# Patient Record
Sex: Male | Born: 1950
Health system: Southern US, Community
[De-identification: ages and names within clinical notes are randomized; demographics above are authoritative.]

## PROBLEM LIST (undated history)

## (undated) DIAGNOSIS — C801 Malignant (primary) neoplasm, unspecified: Secondary | ICD-10-CM

## (undated) DIAGNOSIS — M1990 Unspecified osteoarthritis, unspecified site: Secondary | ICD-10-CM

## (undated) DIAGNOSIS — E119 Type 2 diabetes mellitus without complications: Secondary | ICD-10-CM

## (undated) DIAGNOSIS — B182 Chronic viral hepatitis C: Secondary | ICD-10-CM

## (undated) HISTORY — DX: Chronic viral hepatitis C: B18.2

## (undated) HISTORY — PX: NO PAST SURGERIES: SHX2092

---

## 2012-09-20 DIAGNOSIS — R079 Chest pain, unspecified: Secondary | ICD-10-CM

## 2015-12-24 DIAGNOSIS — Z6828 Body mass index (BMI) 28.0-28.9, adult: Secondary | ICD-10-CM | POA: Diagnosis not present

## 2015-12-24 DIAGNOSIS — E1165 Type 2 diabetes mellitus with hyperglycemia: Secondary | ICD-10-CM | POA: Diagnosis not present

## 2015-12-24 DIAGNOSIS — B001 Herpesviral vesicular dermatitis: Secondary | ICD-10-CM | POA: Diagnosis not present

## 2015-12-24 DIAGNOSIS — Z720 Tobacco use: Secondary | ICD-10-CM | POA: Diagnosis not present

## 2016-03-24 DIAGNOSIS — Z1389 Encounter for screening for other disorder: Secondary | ICD-10-CM | POA: Diagnosis not present

## 2016-03-24 DIAGNOSIS — Z0000 Encounter for general adult medical examination without abnormal findings: Secondary | ICD-10-CM | POA: Diagnosis not present

## 2016-03-24 DIAGNOSIS — R9431 Abnormal electrocardiogram [ECG] [EKG]: Secondary | ICD-10-CM | POA: Diagnosis not present

## 2016-03-24 DIAGNOSIS — Z1211 Encounter for screening for malignant neoplasm of colon: Secondary | ICD-10-CM | POA: Diagnosis not present

## 2016-03-24 DIAGNOSIS — E1165 Type 2 diabetes mellitus with hyperglycemia: Secondary | ICD-10-CM | POA: Diagnosis not present

## 2016-03-24 DIAGNOSIS — Z299 Encounter for prophylactic measures, unspecified: Secondary | ICD-10-CM | POA: Diagnosis not present

## 2016-03-24 DIAGNOSIS — R5383 Other fatigue: Secondary | ICD-10-CM | POA: Diagnosis not present

## 2016-03-24 DIAGNOSIS — Z7189 Other specified counseling: Secondary | ICD-10-CM | POA: Diagnosis not present

## 2016-04-04 NOTE — Progress Notes (Signed)
Patient ID: Spencer Mills, male   DOB: 08/09/1951, 65 y.o.   MRN: [id number]     Cardiology Office Note   Date:  04/06/2016   ID:  Spencer Mills, DOB 07-15-1951, MRN [id number]  PCP:  Monico Blitz, MD  Cardiologist:   Jenkins Rouge, MD   No chief complaint on file.     History of Present Illness: Spencer Mills is a 65 y.o. male who presents for evaluation for stress testing  He is a smoker with borderline HTN and diet controlled  Seen by Dr Manuella Ghazi in [place] and had abnormal ECG See below.  Patient use to be on glucophage But made him feel bad and stopped. 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  Had some SSCP 2 years ago and indicated having a normal ETT but Not clear how this would be given abnormal ECG.  Currently some fatigue and dyspnea. Rare muscular pain in chest. No family history of HOCM.  No palpitations or syncope     PMH:  Diet controlled DM,  HTN Borderline Smoking   Surgical History Hernia surgery   No current outpatient prescriptions on file.   No current facility-administered medications for this visit.  Shah's notes indicate lisinopril 5 mg and ASA  Allergies:   NKDA     Social History:  The patient  reports that he has been smoking.  He does not have any smokeless tobacco history on file. He reports that he drinks alcohol.  Smokes ppd since high school  Retired Passenger transport manager  3 children Wife is Diplomatic Services operational officer but no other exercise   Family History:  The patient's no premature CAD.  HTN on fathers side    ROS:  Please see the history of present illness.   Otherwise, review of systems are positive for none .   All other systems are reviewed and negative.    PHYSICAL EXAM: VS:  BP 140/70 mmHg  Pulse 80  Resp 11  Wt 78.019 kg (172 lb) , BMI There is no height on file to calculate BMI. Affect appropriate Healthy:  appears stated age 68: normal Neck supple with no  adenopathy JVP normal no bruits no thyromegaly Lungs clear with no wheezing and good diaphragmatic motion Heart:  S1/S2 no murmur, no rub, gallop or click PMI normal Abdomen: benighn, BS positve, no tenderness, no AAA no bruit.  No HSM or HJR Distal pulses intact with no bruits No edema Neuro non-focal Skin warm and dry No muscular weakness    EKG:  SR rate 61 LVH and biphasic T waves inferior lateral leads   Recent Labs: No results found for requested labs within last 365 days.    Lipid Panel No results found for: CHOL, TRIG, HDL, CHOLHDL, VLDL, LDLCALC, LDLDIRECT    Wt Readings from Last 3 Encounters:  04/06/16 78.019 kg (172 lb)      Other studies Reviewed: Additional studies/ records that were reviewed today include: Dr Brigitte Pulse notes and ECG.    ASSESSMENT AND PLAN:  1.  Abnormal ECG:  No old to compare.  F/u echo r/o HOCM.  F/U stress myovue r/o CAD 2. HTN:  Borderline not clear that he is taking ACE will encourage with DM especially if he has HTN response to exercise 3. DM:  Discussed low carb diet.  Target hemoglobin A1c is 6.5 or less.  Continue current medications. 4. Smoking:  Smoking cessation instruction/counseling  given:  counseled patient on the dangers of tobacco use, advised patient to stop smoking, and reviewed strategies to maximize success   Current medicines are reviewed at length with the patient today.  The patient does not have concerns regarding medicines.  The following changes have been made:  no change  Labs/ tests ordered today include: Echo and stress myovue   Orders Placed This Encounter  Procedures  . NM Myocar Multi W/Spect W/Wall Motion / EF  . ECHOCARDIOGRAM COMPLETE     Disposition:   FU with me next available      Signed, Jenkins Rouge, MD  04/06/2016 11:28 AM    [facility] [address], [place], [place]  [address] Phone: [phone]; Fax: [phone]

## 2016-04-06 ENCOUNTER — Encounter: Payer: Self-pay | Admitting: *Deleted

## 2016-04-06 ENCOUNTER — Ambulatory Visit (INDEPENDENT_AMBULATORY_CARE_PROVIDER_SITE_OTHER): Payer: PPO | Admitting: Cardiovascular Disease

## 2016-04-06 ENCOUNTER — Encounter: Payer: Self-pay | Admitting: Cardiovascular Disease

## 2016-04-06 VITALS — BP 140/70 | HR 80 | Resp 11 | Wt 172.0 lb

## 2016-04-06 DIAGNOSIS — Z125 Encounter for screening for malignant neoplasm of prostate: Secondary | ICD-10-CM | POA: Diagnosis not present

## 2016-04-06 DIAGNOSIS — R079 Chest pain, unspecified: Secondary | ICD-10-CM

## 2016-04-06 DIAGNOSIS — I422 Other hypertrophic cardiomyopathy: Secondary | ICD-10-CM

## 2016-04-06 DIAGNOSIS — Z79899 Other long term (current) drug therapy: Secondary | ICD-10-CM | POA: Diagnosis not present

## 2016-04-06 DIAGNOSIS — R5383 Other fatigue: Secondary | ICD-10-CM | POA: Diagnosis not present

## 2016-04-06 DIAGNOSIS — E1165 Type 2 diabetes mellitus with hyperglycemia: Secondary | ICD-10-CM | POA: Diagnosis not present

## 2016-04-06 DIAGNOSIS — E7800 Pure hypercholesterolemia, unspecified: Secondary | ICD-10-CM | POA: Diagnosis not present

## 2016-04-06 NOTE — Patient Instructions (Signed)
Your physician recommends that you schedule a follow-up appointment follow-up after test.   Your physician recommends that you continue on your current medications as directed. Please refer to the Current Medication list given to you today.  Your physician has requested that you have an echocardiogram. Echocardiography is a painless test that uses sound waves to create images of your heart. It provides your doctor with information about the size and shape of your heart and how well your heart's chambers and valves are working. This procedure takes approximately one hour. There are no restrictions for this procedure.  Your physician has requested that you have en exercise stress myoview. For further information please visit [email or web address]. Please follow instruction sheet, as given.  If you need a refill on your cardiac medications before your next appointment, please call your pharmacy.  Thank you for choosing [facility]!

## 2016-04-20 ENCOUNTER — Encounter (HOSPITAL_COMMUNITY): Payer: PPO

## 2016-04-20 ENCOUNTER — Ambulatory Visit (HOSPITAL_COMMUNITY): Admission: RE | Admit: 2016-04-20 | Payer: PPO | Source: Ambulatory Visit

## 2016-04-20 ENCOUNTER — Inpatient Hospital Stay (HOSPITAL_COMMUNITY): Admission: RE | Admit: 2016-04-20 | Payer: PPO | Source: Ambulatory Visit

## 2016-04-27 DIAGNOSIS — E7800 Pure hypercholesterolemia, unspecified: Secondary | ICD-10-CM | POA: Diagnosis not present

## 2016-04-27 DIAGNOSIS — R972 Elevated prostate specific antigen [PSA]: Secondary | ICD-10-CM | POA: Diagnosis not present

## 2016-04-27 DIAGNOSIS — N181 Chronic kidney disease, stage 1: Secondary | ICD-10-CM | POA: Diagnosis not present

## 2016-04-27 DIAGNOSIS — E1122 Type 2 diabetes mellitus with diabetic chronic kidney disease: Secondary | ICD-10-CM | POA: Diagnosis not present

## 2016-04-28 ENCOUNTER — Encounter (HOSPITAL_COMMUNITY)
Admission: RE | Admit: 2016-04-28 | Discharge: 2016-04-28 | Disposition: A | Discharge status: Home / Self Care | Payer: PPO | Source: Ambulatory Visit | Attending: Cardiovascular Disease | Admitting: Cardiovascular Disease

## 2016-04-28 ENCOUNTER — Ambulatory Visit (HOSPITAL_COMMUNITY)
Admission: RE | Admit: 2016-04-28 | Discharge: 2016-04-28 | Disposition: A | Discharge status: Home / Self Care | Payer: PPO | Source: Ambulatory Visit | Attending: Cardiovascular Disease | Admitting: Cardiovascular Disease

## 2016-04-28 ENCOUNTER — Inpatient Hospital Stay (HOSPITAL_COMMUNITY): Admission: RE | Admit: 2016-04-28 | Payer: PPO | Source: Ambulatory Visit

## 2016-04-28 ENCOUNTER — Encounter (HOSPITAL_COMMUNITY): Payer: Self-pay

## 2016-04-28 DIAGNOSIS — I421 Obstructive hypertrophic cardiomyopathy: Secondary | ICD-10-CM | POA: Diagnosis not present

## 2016-04-28 DIAGNOSIS — I422 Other hypertrophic cardiomyopathy: Secondary | ICD-10-CM | POA: Diagnosis not present

## 2016-04-28 DIAGNOSIS — R079 Chest pain, unspecified: Secondary | ICD-10-CM | POA: Diagnosis not present

## 2016-04-28 DIAGNOSIS — I517 Cardiomegaly: Secondary | ICD-10-CM | POA: Insufficient documentation

## 2016-04-28 HISTORY — DX: Type 2 diabetes mellitus without complications: E11.9

## 2016-04-28 LAB — ECHOCARDIOGRAM COMPLETE
CHL CUP RV SYS PRESS: 22 mmHg
CHL CUP STROKE VOLUME: 39 mL
E decel time: 243 msec
EERAT: 7.81
FS: 42 % (ref 28–44)
IVS/LV PW RATIO, ED: 1.11
LA ID, A-P, ES: 41 mm
LA diam index: 2.11 cm/m2
LA vol A4C: 61 ml
LA vol index: 31.6 mL/m2
LAVOL: 61.5 mL
LEFT ATRIUM END SYS DIAM: 41 mm
LV E/e'average: 7.81
LV SIMPSON'S DISK: 63
LV TDI E'LATERAL: 9.36
LV dias vol index: 32 mL/m2
LV dias vol: 62 mL (ref 62–150)
LV sys vol index: 12 mL/m2
LVEEMED: 7.81
LVELAT: 9.36 cm/s
LVOT area: 3.14 cm2
LVOT diameter: 20 mm
LVSYSVOL: 23 mL (ref 21–61)
MV Dec: 243
MV Peak grad: 2 mmHg
MVPKAVEL: 66.7 m/s
MVPKEVEL: 73.1 m/s
PW: 12.7 mm — AB (ref 0.6–1.1)
RV LATERAL S' VELOCITY: 16.2 cm/s
Reg peak vel: 189 cm/s
TAPSE: 27.7 mm
TDI e' medial: 5.87
TR max vel: 189 cm/s

## 2016-04-28 LAB — NM MYOCAR MULTI W/SPECT W/WALL MOTION / EF
CHL CUP MPHR: 155 {beats}/min
CHL CUP NUCLEAR SDS: 0
CHL CUP RESTING HR STRESS: 48 {beats}/min
CSEPEW: 10.1 METS
CSEPHR: 71 %
CSEPPHR: 111 {beats}/min
Exercise duration (min): 7 min
Exercise duration (sec): 15 s
LV dias vol: 94 mL (ref 62–150)
LV sys vol: 40 mL
RATE: 0.35
RPE: 12
SRS: 0
SSS: 0
TID: 0.97

## 2016-04-28 MED ORDER — REGADENOSON 0.4 MG/5ML IV SOLN
INTRAVENOUS | Status: AC
  Administered 2016-04-28: 0.4 mg via INTRAVENOUS
  Filled 2016-04-28: qty 5

## 2016-04-28 MED ORDER — TECHNETIUM TC 99M TETROFOSMIN IV KIT
10.0000 | PACK | Freq: Once | INTRAVENOUS | Status: AC | PRN
  Administered 2016-04-28: 10.6 via INTRAVENOUS

## 2016-04-28 MED ORDER — TECHNETIUM TC 99M TETROFOSMIN IV KIT
30.0000 | PACK | Freq: Once | INTRAVENOUS | Status: AC | PRN
  Administered 2016-04-28: 31 via INTRAVENOUS

## 2016-04-28 MED ORDER — SODIUM CHLORIDE 0.9% FLUSH
INTRAVENOUS | Status: AC
  Administered 2016-04-28: 10 mL via INTRAVENOUS
  Filled 2016-04-28: qty 10

## 2016-04-28 NOTE — Progress Notes (Signed)
*  PRELIMINARY RESULTS* Echocardiogram 2D Echocardiogram has been performed.  Spencer Mills 04/28/2016, 11:21 AM

## 2016-04-29 ENCOUNTER — Ambulatory Visit (INDEPENDENT_AMBULATORY_CARE_PROVIDER_SITE_OTHER): Payer: PPO | Admitting: Cardiology

## 2016-04-29 ENCOUNTER — Encounter: Payer: Self-pay | Admitting: Cardiology

## 2016-04-29 VITALS — BP 151/80 | HR 66 | Ht 68.0 in | Wt 172.0 lb

## 2016-04-29 DIAGNOSIS — R9431 Abnormal electrocardiogram [ECG] [EKG]: Secondary | ICD-10-CM

## 2016-04-29 DIAGNOSIS — R030 Elevated blood-pressure reading, without diagnosis of hypertension: Secondary | ICD-10-CM

## 2016-04-29 DIAGNOSIS — IMO0001 Reserved for inherently not codable concepts without codable children: Secondary | ICD-10-CM

## 2016-04-29 NOTE — Patient Instructions (Signed)
Medication Instructions:  Your physician recommends that you continue on your current medications as directed. Please refer to the Current Medication list given to you today. 3  Labwork: NONE  Testing/Procedures: NONE  Follow-Up: Your physician wants you to follow-up in: 6 MONTHS.  You will receive a reminder letter in the mail two months in advance. If you don't receive a letter, please call our office to schedule the follow-up appointment.   Any Other Special Instructions Will Be Listed Below (If Applicable).  DASH Eating Plan DASH stands for "Dietary Approaches to Stop Hypertension." The DASH eating plan is a healthy eating plan that has been shown to reduce high blood pressure (hypertension). Additional health benefits may include reducing the risk of type 2 diabetes mellitus, heart disease, and stroke. The DASH eating plan may also help with weight loss. WHAT DO I NEED TO KNOW ABOUT THE DASH EATING PLAN? For the DASH eating plan, you will follow these general guidelines:  Choose foods with a percent daily value for sodium of less than 5% (as listed on the food label).  Use salt-free seasonings or herbs instead of table salt or sea salt.  Check with your health care provider or pharmacist before using salt substitutes.  Eat lower-sodium products, often labeled as "lower sodium" or "no salt added."  Eat fresh foods.  Eat more vegetables, fruits, and low-fat dairy products.  Choose whole grains. Look for the word "whole" as the first word in the ingredient list.  Choose fish and skinless chicken or [place] more often than red meat. Limit fish, poultry, and meat to 6 oz (170 g) each day.  Limit sweets, desserts, sugars, and sugary drinks.  Choose heart-healthy fats.  Limit cheese to 1 oz (28 g) per day.  Eat more home-cooked food and less restaurant, buffet, and fast food.  Limit fried foods.  Cook foods using methods other than frying.  Limit canned vegetables. If  you do use them, rinse them well to decrease the sodium.  When eating at a restaurant, ask that your food be prepared with less salt, or no salt if possible. WHAT FOODS CAN I EAT? Seek help from a dietitian for individual calorie needs. Grains Whole grain or whole wheat bread. Brown rice. Whole grain or whole wheat pasta. Quinoa, bulgur, and whole grain cereals. Low-sodium cereals. Corn or whole wheat flour tortillas. Whole grain cornbread. Whole grain crackers. Low-sodium crackers. Vegetables Fresh or frozen vegetables (raw, steamed, roasted, or grilled). Low-sodium or reduced-sodium tomato and vegetable juices. Low-sodium or reduced-sodium tomato sauce and paste. Low-sodium or reduced-sodium canned vegetables.  Fruits All fresh, canned (in natural juice), or frozen fruits. Meat and Other Protein Products Ground beef (85% or leaner), grass-fed beef, or beef trimmed of fat. Skinless chicken or [place]. Ground chicken or [place]. Pork trimmed of fat. All fish and seafood. Eggs. Dried beans, peas, or lentils. Unsalted nuts and seeds. Unsalted canned beans. Dairy Low-fat dairy products, such as skim or 1% milk, 2% or reduced-fat cheeses, low-fat ricotta or cottage cheese, or plain low-fat yogurt. Low-sodium or reduced-sodium cheeses. Fats and Oils Tub margarines without trans fats. Light or reduced-fat mayonnaise and salad dressings (reduced sodium). Avocado. Safflower, olive, or canola oils. Natural peanut or almond butter. Other Unsalted popcorn and pretzels. The items listed above may not be a complete list of recommended foods or beverages. Contact your dietitian for more options. WHAT FOODS ARE NOT RECOMMENDED? Grains White bread. White pasta. White rice. Refined cornbread. Bagels and croissants. Crackers that contain  trans fat. Vegetables Creamed or fried vegetables. Vegetables in a cheese sauce. Regular canned vegetables. Regular canned tomato sauce and paste. Regular tomato and vegetable  juices. Fruits Dried fruits. Canned fruit in light or heavy syrup. Fruit juice. Meat and Other Protein Products Fatty cuts of meat. Ribs, chicken wings, bacon, sausage, bologna, salami, chitterlings, fatback, hot dogs, bratwurst, and packaged luncheon meats. Salted nuts and seeds. Canned beans with salt. Dairy Whole or 2% milk, cream, half-and-half, and cream cheese. Whole-fat or sweetened yogurt. Full-fat cheeses or blue cheese. Nondairy creamers and whipped toppings. Processed cheese, cheese spreads, or cheese curds. Condiments Onion and garlic salt, seasoned salt, table salt, and sea salt. Canned and packaged gravies. Worcestershire sauce. Tartar sauce. Barbecue sauce. Teriyaki sauce. Soy sauce, including reduced sodium. Steak sauce. Fish sauce. Oyster sauce. Cocktail sauce. Horseradish. Ketchup and mustard. Meat flavorings and tenderizers. Bouillon cubes. Hot sauce. Tabasco sauce. Marinades. Taco seasonings. Relishes. Fats and Oils Butter, stick margarine, lard, shortening, ghee, and bacon fat. Coconut, palm kernel, or palm oils. Regular salad dressings. Other Pickles and olives. Salted popcorn and pretzels. The items listed above may not be a complete list of foods and beverages to avoid. Contact your dietitian for more information. WHERE CAN I FIND MORE INFORMATION? National Heart, Lung, and Blood Institute: travelstabloid.com   This information is not intended to replace advice given to you by your health care provider. Make sure you discuss any questions you have with your health care provider.   Document Released: 10/06/2011 Document Revised: 11/07/2014 Document Reviewed: 08/21/2013 Elsevier Interactive Patient Education [employer].    If you need a refill on your cardiac medications before your next appointment, please call your pharmacy.

## 2016-04-29 NOTE — Progress Notes (Signed)
     Clinical Summary Mr. Spencer Mills is a 65 y.o.male last seen by Dr Johnsie Cancel, this is our first visit together. He is seen for the following medical problems.  1. Abnormal EKG - EKG shows LVH with strain pattern, cannot rule out lateral ischemia - echo 03/2016 LVEF 60-65%, mild LVH.  - 03/2016 exercise nuclear stress without ischemia. Exercised 7 minutes - he denies any chest pain, SOB, DOE  Past Medical History  Diagnosis Date  . Diabetes mellitus without complication ([place])      Medications None   No current outpatient prescriptions on file.   No current facility-administered medications for this visit.     No past surgical history on file.     No family history on file.   Social History Mr. Spencer Mills reports that he has been smoking.  He does not have any smokeless tobacco history on file. Mr. Spencer Mills reports that he drinks alcohol.   Review of Systems CONSTITUTIONAL: No weight loss, fever, chills, weakness or fatigue.  HEENT: Eyes: No visual loss, blurred vision, double vision or yellow sclerae.No hearing loss, sneezing, congestion, runny nose or sore throat.  SKIN: No rash or itching.  CARDIOVASCULAR: per HPI RESPIRATORY: No shortness of breath, cough or sputum.  GASTROINTESTINAL: No anorexia, nausea, vomiting or diarrhea. No abdominal pain or blood.  GENITOURINARY: No burning on urination, no polyuria NEUROLOGICAL: No headache, dizziness, syncope, paralysis, ataxia, numbness or tingling in the extremities. No change in bowel or bladder control.  MUSCULOSKELETAL: No muscle, back pain, joint pain or stiffness.  LYMPHATICS: No enlarged nodes. No history of splenectomy.  PSYCHIATRIC: No history of depression or anxiety.  ENDOCRINOLOGIC: No reports of sweating, cold or heat intolerance. No polyuria or polydipsia.  Marland Kitchen   Physical Examination Filed Vitals:   04/29/16 1033  BP: 151/80  Pulse: 66   Filed Vitals:   04/29/16 1033  Height: 5\' 8"  (1.727 m)  Weight: 172  lb (78.019 kg)    Gen: resting comfortably, no acute distress HEENT: no scleral icterus, pupils equal round and reactive, no palptable cervical adenopathy,  CV: RRR, no m/r/g, no jvd Resp: Clear to auscultation bilaterally GI: abdomen is soft, non-tender, non-distended, normal bowel sounds, no hepatosplenomegaly MSK: extremities are warm, no edema.  Skin: warm, no rash Neuro:  no focal deficits Psych: appropriate affect     Assessment and Plan  1. Abnormal EKG - no cardiac symptoms - echo and stress test show no evidence of significant structural heart disease. EKG findings likely secondary to his mild LVH.   2. Elevated blood pressure Continue to monitor bp's, he has no history of HTN however bp is elevated in clinic today. Will provide info on DASH diet, encouraged diet and exercise chnages. May need HTN meds in the near future.  - No further cardiac testing at this time  F/u 6 months      Arnoldo Lenis, M.D.

## 2016-05-11 DIAGNOSIS — E119 Type 2 diabetes mellitus without complications: Secondary | ICD-10-CM | POA: Diagnosis not present

## 2016-05-11 DIAGNOSIS — I10 Essential (primary) hypertension: Secondary | ICD-10-CM | POA: Diagnosis not present

## 2016-07-11 DIAGNOSIS — E7800 Pure hypercholesterolemia, unspecified: Secondary | ICD-10-CM | POA: Diagnosis not present

## 2016-07-11 DIAGNOSIS — Z6826 Body mass index (BMI) 26.0-26.9, adult: Secondary | ICD-10-CM | POA: Diagnosis not present

## 2016-07-11 DIAGNOSIS — E1122 Type 2 diabetes mellitus with diabetic chronic kidney disease: Secondary | ICD-10-CM | POA: Diagnosis not present

## 2016-07-11 DIAGNOSIS — I10 Essential (primary) hypertension: Secondary | ICD-10-CM | POA: Diagnosis not present

## 2016-07-11 DIAGNOSIS — N181 Chronic kidney disease, stage 1: Secondary | ICD-10-CM | POA: Diagnosis not present

## 2016-08-11 DIAGNOSIS — I10 Essential (primary) hypertension: Secondary | ICD-10-CM | POA: Diagnosis not present

## 2016-08-11 DIAGNOSIS — E119 Type 2 diabetes mellitus without complications: Secondary | ICD-10-CM | POA: Diagnosis not present

## 2016-08-19 DIAGNOSIS — E1122 Type 2 diabetes mellitus with diabetic chronic kidney disease: Secondary | ICD-10-CM | POA: Diagnosis not present

## 2016-08-19 DIAGNOSIS — Z6825 Body mass index (BMI) 25.0-25.9, adult: Secondary | ICD-10-CM | POA: Diagnosis not present

## 2016-08-19 DIAGNOSIS — F17200 Nicotine dependence, unspecified, uncomplicated: Secondary | ICD-10-CM | POA: Diagnosis not present

## 2016-08-19 DIAGNOSIS — Z299 Encounter for prophylactic measures, unspecified: Secondary | ICD-10-CM | POA: Diagnosis not present

## 2016-08-19 DIAGNOSIS — E7800 Pure hypercholesterolemia, unspecified: Secondary | ICD-10-CM | POA: Diagnosis not present

## 2016-08-19 DIAGNOSIS — N181 Chronic kidney disease, stage 1: Secondary | ICD-10-CM | POA: Diagnosis not present

## 2016-08-19 DIAGNOSIS — K219 Gastro-esophageal reflux disease without esophagitis: Secondary | ICD-10-CM | POA: Diagnosis not present

## 2016-08-23 DIAGNOSIS — N181 Chronic kidney disease, stage 1: Secondary | ICD-10-CM | POA: Diagnosis not present

## 2016-08-23 DIAGNOSIS — E1122 Type 2 diabetes mellitus with diabetic chronic kidney disease: Secondary | ICD-10-CM | POA: Diagnosis not present

## 2016-08-23 DIAGNOSIS — R972 Elevated prostate specific antigen [PSA]: Secondary | ICD-10-CM | POA: Diagnosis not present

## 2016-08-23 DIAGNOSIS — Z6825 Body mass index (BMI) 25.0-25.9, adult: Secondary | ICD-10-CM | POA: Diagnosis not present

## 2016-08-23 DIAGNOSIS — R198 Other specified symptoms and signs involving the digestive system and abdomen: Secondary | ICD-10-CM | POA: Diagnosis not present

## 2016-08-23 DIAGNOSIS — E7800 Pure hypercholesterolemia, unspecified: Secondary | ICD-10-CM | POA: Diagnosis not present

## 2016-08-23 DIAGNOSIS — Z299 Encounter for prophylactic measures, unspecified: Secondary | ICD-10-CM | POA: Diagnosis not present

## 2016-08-24 DIAGNOSIS — R109 Unspecified abdominal pain: Secondary | ICD-10-CM | POA: Diagnosis not present

## 2016-08-24 DIAGNOSIS — R935 Abnormal findings on diagnostic imaging of other abdominal regions, including retroperitoneum: Secondary | ICD-10-CM | POA: Diagnosis not present

## 2016-08-31 DIAGNOSIS — Z299 Encounter for prophylactic measures, unspecified: Secondary | ICD-10-CM | POA: Diagnosis not present

## 2016-08-31 DIAGNOSIS — Z6825 Body mass index (BMI) 25.0-25.9, adult: Secondary | ICD-10-CM | POA: Diagnosis not present

## 2016-08-31 DIAGNOSIS — K859 Acute pancreatitis without necrosis or infection, unspecified: Secondary | ICD-10-CM | POA: Diagnosis not present

## 2016-08-31 DIAGNOSIS — B182 Chronic viral hepatitis C: Secondary | ICD-10-CM

## 2016-08-31 DIAGNOSIS — E7800 Pure hypercholesterolemia, unspecified: Secondary | ICD-10-CM | POA: Diagnosis not present

## 2016-08-31 DIAGNOSIS — R198 Other specified symptoms and signs involving the digestive system and abdomen: Secondary | ICD-10-CM | POA: Diagnosis not present

## 2016-08-31 DIAGNOSIS — K219 Gastro-esophageal reflux disease without esophagitis: Secondary | ICD-10-CM | POA: Diagnosis not present

## 2016-08-31 HISTORY — DX: Chronic viral hepatitis C: B18.2

## 2016-09-09 DIAGNOSIS — I10 Essential (primary) hypertension: Secondary | ICD-10-CM | POA: Diagnosis not present

## 2016-09-09 DIAGNOSIS — E119 Type 2 diabetes mellitus without complications: Secondary | ICD-10-CM | POA: Diagnosis not present

## 2016-09-16 DIAGNOSIS — K859 Acute pancreatitis without necrosis or infection, unspecified: Secondary | ICD-10-CM | POA: Diagnosis not present

## 2016-10-03 DIAGNOSIS — E119 Type 2 diabetes mellitus without complications: Secondary | ICD-10-CM | POA: Diagnosis not present

## 2016-10-03 DIAGNOSIS — I10 Essential (primary) hypertension: Secondary | ICD-10-CM | POA: Diagnosis not present

## 2016-10-12 DIAGNOSIS — Z299 Encounter for prophylactic measures, unspecified: Secondary | ICD-10-CM | POA: Diagnosis not present

## 2016-10-12 DIAGNOSIS — E1122 Type 2 diabetes mellitus with diabetic chronic kidney disease: Secondary | ICD-10-CM | POA: Diagnosis not present

## 2016-10-12 DIAGNOSIS — R7989 Other specified abnormal findings of blood chemistry: Secondary | ICD-10-CM | POA: Diagnosis not present

## 2016-10-12 DIAGNOSIS — N181 Chronic kidney disease, stage 1: Secondary | ICD-10-CM | POA: Diagnosis not present

## 2016-10-12 DIAGNOSIS — Z6826 Body mass index (BMI) 26.0-26.9, adult: Secondary | ICD-10-CM | POA: Diagnosis not present

## 2016-11-04 DIAGNOSIS — R7989 Other specified abnormal findings of blood chemistry: Secondary | ICD-10-CM | POA: Diagnosis not present

## 2016-11-07 DIAGNOSIS — R945 Abnormal results of liver function studies: Secondary | ICD-10-CM | POA: Diagnosis not present

## 2016-11-07 DIAGNOSIS — N281 Cyst of kidney, acquired: Secondary | ICD-10-CM | POA: Diagnosis not present

## 2016-11-07 DIAGNOSIS — K8020 Calculus of gallbladder without cholecystitis without obstruction: Secondary | ICD-10-CM | POA: Diagnosis not present

## 2016-11-08 DIAGNOSIS — R768 Other specified abnormal immunological findings in serum: Secondary | ICD-10-CM | POA: Diagnosis not present

## 2016-11-10 DIAGNOSIS — E119 Type 2 diabetes mellitus without complications: Secondary | ICD-10-CM | POA: Diagnosis not present

## 2016-11-10 DIAGNOSIS — I10 Essential (primary) hypertension: Secondary | ICD-10-CM | POA: Diagnosis not present

## 2016-11-14 DIAGNOSIS — Z713 Dietary counseling and surveillance: Secondary | ICD-10-CM | POA: Diagnosis not present

## 2016-11-14 DIAGNOSIS — Z87891 Personal history of nicotine dependence: Secondary | ICD-10-CM | POA: Diagnosis not present

## 2016-11-14 DIAGNOSIS — E7800 Pure hypercholesterolemia, unspecified: Secondary | ICD-10-CM | POA: Diagnosis not present

## 2016-11-14 DIAGNOSIS — Z6828 Body mass index (BMI) 28.0-28.9, adult: Secondary | ICD-10-CM | POA: Diagnosis not present

## 2016-11-14 DIAGNOSIS — N181 Chronic kidney disease, stage 1: Secondary | ICD-10-CM | POA: Diagnosis not present

## 2016-11-14 DIAGNOSIS — E1122 Type 2 diabetes mellitus with diabetic chronic kidney disease: Secondary | ICD-10-CM | POA: Diagnosis not present

## 2016-11-14 DIAGNOSIS — B1920 Unspecified viral hepatitis C without hepatic coma: Secondary | ICD-10-CM | POA: Diagnosis not present

## 2016-11-14 DIAGNOSIS — Z299 Encounter for prophylactic measures, unspecified: Secondary | ICD-10-CM | POA: Diagnosis not present

## 2016-11-14 DIAGNOSIS — I10 Essential (primary) hypertension: Secondary | ICD-10-CM | POA: Diagnosis not present

## 2016-11-21 ENCOUNTER — Encounter: Payer: Self-pay | Admitting: Gastroenterology

## 2016-12-08 ENCOUNTER — Ambulatory Visit (INDEPENDENT_AMBULATORY_CARE_PROVIDER_SITE_OTHER): Payer: PPO | Admitting: Gastroenterology

## 2016-12-08 ENCOUNTER — Encounter: Payer: Self-pay | Admitting: Gastroenterology

## 2016-12-08 DIAGNOSIS — B182 Chronic viral hepatitis C: Secondary | ICD-10-CM

## 2016-12-08 DIAGNOSIS — K859 Acute pancreatitis without necrosis or infection, unspecified: Secondary | ICD-10-CM | POA: Insufficient documentation

## 2016-12-08 DIAGNOSIS — Z1211 Encounter for screening for malignant neoplasm of colon: Secondary | ICD-10-CM

## 2016-12-08 DIAGNOSIS — K852 Alcohol induced acute pancreatitis without necrosis or infection: Secondary | ICD-10-CM

## 2016-12-08 LAB — CBC WITH DIFFERENTIAL/PLATELET
BASOS ABS: 88 {cells}/uL (ref 0–200)
Basophils Relative: 1 %
EOS ABS: 616 {cells}/uL — AB (ref 15–500)
EOS PCT: 7 %
HCT: 45.9 % (ref 38.5–50.0)
HEMOGLOBIN: 15.6 g/dL (ref 13.2–17.1)
Lymphocytes Relative: 49 %
Lymphs Abs: 4312 cells/uL — ABNORMAL HIGH (ref 850–3900)
MCH: 31.4 pg (ref 27.0–33.0)
MCHC: 34 g/dL (ref 32.0–36.0)
MCV: 92.4 fL (ref 80.0–100.0)
MPV: 10.8 fL (ref 7.5–12.5)
Monocytes Absolute: 704 cells/uL (ref 200–950)
Monocytes Relative: 8 %
NEUTROS ABS: 3080 {cells}/uL (ref 1500–7800)
NEUTROS PCT: 35 %
Platelets: 217 10*3/uL (ref 140–400)
RBC: 4.97 MIL/uL (ref 4.20–5.80)
RDW: 14.9 % (ref 11.0–15.0)
WBC: 8.8 10*3/uL (ref 3.8–10.8)

## 2016-12-08 NOTE — Progress Notes (Signed)
On recall  °

## 2016-12-08 NOTE — Assessment & Plan Note (Signed)
AVERAGE RISK. PT REPORTS RECENT TCS.  OBTAIN TCS FROM [place].

## 2016-12-08 NOTE — Patient Instructions (Addendum)
I PERSONALLY REVIEWED YOUR CT FROM OCT 2017 WITH OUR RADIOLOGIST, DR. Thornton Papas. YOU DO NOT HAVE CIRRHOSIS.  COMPLETE LABS TODAY. I WILL CALL YOU WITH YOUR RESULTS IN 7-10 DAYS.  FOLLOW UP IN 3 MOS.

## 2016-12-08 NOTE — Assessment & Plan Note (Signed)
SYMPTOMS CONTROLLED/RESOLVED.  REPEAT LIPASE.

## 2016-12-08 NOTE — Assessment & Plan Note (Signed)
CT OCT 2017-NO CIRRHOSIS. NEWLY DIAGNOSED WITH HEP C BUT MAY HAVE KNOWN FOR 20 YRS. WAS CONSUMING ETOH REGULARLY BUT NOT NOW. LIPASE ELEVATED NOV 2017 AND CT OCT 2017 SHOWS PANCREATITIS.  I PERSONALLY REVIEWED THE CT OCT 2017 [place] Thornton Papas.  COMPLETE LABS. WILL CALL WITH RESULTS IN 7-10 DAYS AND START THERAPY. CONSIDER HEP C RX FOLLOW UP IN 3 MOS.

## 2016-12-08 NOTE — Progress Notes (Signed)
   Subjective:    Patient ID: Spencer Mills, male    DOB: [birth year], 66 y.o.   MRN: [id number]  [facility], MD  HPI Just discovered HAD HEP C. WAS IN  MILITARY. STATIONED IN [place]. GOT MANY IMMUNIZATIONS IN MILITARY. NO TATTOOS. NO JAUNDICE. BMs: GOOD. APPETITE: GOOD. HAD COLONOSCOPY ~2 YRS AGO WITH DR. [facility]. NO BLOOD TRANSFUSIONS.   PT DENIES FEVER, CHILLS, HEMATOCHEZIA, HEMATEMESIS, nausea, vomiting, melena, diarrhea, CHEST PAIN, SHORTNESS OF BREATH,  CHANGE IN BOWEL IN HABITS, constipation, abdominal pain, problems swallowing, OR heartburn or indigestion.  Past Medical History:  Diagnosis Date  . Diabetes mellitus without complication ([place])   . Hep C w/o coma, chronic ([place]) 08/2016    NO SURGERIES EVER  No Known Allergies  Current Outpatient Prescriptions  Medication Sig Dispense Refill  . aspirin EC 81 MG tablet Take 81 mg by mouth daily. Pt takes occasionally    . lisinopril (PRINIVIL,ZESTRIL) 5 MG tablet Take 5 mg by mouth daily.     FAMILY HISTORY: NO COLON CANCER/POLYPS  Social History  Substance Use Topics  . Smoking status: Current Every Day Smoker OFF AND ON    Packs/day: 1.00    Types: Cigarettes    Start date: 04/29/1966  . Smokeless tobacco: Never Used  . Alcohol use OCCASIONAL-NOT EVERY DAY OR WEEK. 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  Review of Systems PER HPI OTHERWISE ALL SYSTEMS ARE NEGATIVE.    Objective:   Physical Exam  Constitutional: He is oriented to person, place, and time. He appears well-developed and well-nourished. No distress.  HENT:  Head: Normocephalic and atraumatic.  Mouth/Throat: Oropharynx is clear and moist. No oropharyngeal exudate.  Eyes: Pupils are equal, round, and reactive to light. No scleral icterus.  Neck: Normal range of motion. Neck supple.  Cardiovascular: Normal rate, regular rhythm and normal heart sounds.   Pulmonary/Chest: Effort normal and breath sounds normal.  No respiratory distress.  Abdominal: Soft. Bowel sounds are normal. He exhibits no distension. There is no tenderness.  Musculoskeletal: He exhibits no edema.  Lymphadenopathy:    He has no cervical adenopathy.  Neurological: He is alert and oriented to person, place, and time.  NO FOCAL DEFICITS  Psychiatric: He has a normal mood and affect.  Vitals reviewed.     Assessment & Plan:

## 2016-12-08 NOTE — Progress Notes (Signed)
cc'ed to pcp °

## 2016-12-09 ENCOUNTER — Telehealth: Payer: Self-pay

## 2016-12-09 LAB — HEPATITIS A ANTIBODY, TOTAL: Hep A Total Ab: NONREACTIVE

## 2016-12-09 LAB — LIPASE: Lipase: 306 U/L — ABNORMAL HIGH (ref 7–60)

## 2016-12-09 NOTE — Telephone Encounter (Signed)
Pt assymptomatic. I WILL CALL PT WITH RESULTS.

## 2016-12-09 NOTE — Telephone Encounter (Signed)
Noted  

## 2016-12-09 NOTE — Telephone Encounter (Signed)
T/C from [place] at [place], reporting critical lab of LIPASE 306.  She said it was repeated and verified.  I am forwarding this to Dr. Oneida Alar and sending her a pager report.

## 2016-12-11 LAB — HCV RNA,LIPA RFLX NS5A DRUG RESIST

## 2016-12-11 NOTE — Telephone Encounter (Addendum)
Called patient TO DISCUSS RESULTS. LVM-LIPASE ELEVATED. HE SHOULD AVOID ETOH. IF LIPASE REMAINS ELEVATED HE SHOULD HAVE EUS. REPEAT LIPASE IN ONE MONTH. HE CAN CALL [phone] OR [phone] WITH QUESTIONS.  HE IS NOT IMMUNE TO HEP A AND SHOULD RECEIVE THE VACCINE.

## 2016-12-12 ENCOUNTER — Telehealth: Payer: Self-pay | Admitting: Gastroenterology

## 2016-12-12 ENCOUNTER — Other Ambulatory Visit: Payer: Self-pay

## 2016-12-12 DIAGNOSIS — R748 Abnormal levels of other serum enzymes: Secondary | ICD-10-CM

## 2016-12-12 NOTE — Telephone Encounter (Signed)
REPEAT LIPASE IN ONE MONTH

## 2016-12-12 NOTE — Telephone Encounter (Signed)
Pt is aware.  

## 2016-12-12 NOTE — Telephone Encounter (Signed)
Lab order on file for 01/09/2017. Pt is aware.

## 2016-12-12 NOTE — Telephone Encounter (Signed)
Sent note to nurse

## 2016-12-14 ENCOUNTER — Telehealth: Payer: Self-pay | Admitting: Gastroenterology

## 2016-12-14 DIAGNOSIS — B182 Chronic viral hepatitis C: Secondary | ICD-10-CM

## 2016-12-14 LAB — PROMETHEUS-MAIL

## 2016-12-14 NOTE — Telephone Encounter (Signed)
PT HAS HEP C GENOTYPE 1b. IMAGING AND PLT CT SHOW NO EVIDENCE TO CIRRHOSIS. FIBROSURE INDICATES FIBROSIS/POSSIBLE CIRRHOSIS. PT NOT IMMUNE TO HEP A OR HEP B, BUT POS HEP B cAb IgG. PT NEEDS HEP B sAg. IF HEP B sAg NEGATIVE WILL TREAT WITH MAVYRET PER RECOMMENDATIONS OF [facility]ASLD-PT With compensated cirrhosis (Child-Pugh A): Glecaprevir 300 mg/pibrentasvir 120 mg orally once daily with food for 12 weeks & HE WILL NEED TWINRIX VACCINE.   NEEDS HCV RNA 4 WEEKS AFTER THERAPY INITIATED AND 12 WEEKS AFTER THERAPY.

## 2016-12-15 DIAGNOSIS — B182 Chronic viral hepatitis C: Secondary | ICD-10-CM | POA: Diagnosis not present

## 2016-12-15 LAB — PROTIME-INR
INR: 1
Prothrombin Time: 11.1 s (ref 9.0–11.5)

## 2016-12-15 NOTE — Telephone Encounter (Signed)
Lab orders faxed to [facility].  

## 2016-12-16 LAB — HEPATITIS B CORE ANTIBODY, IGM: HEP B C IGM: NONREACTIVE

## 2016-12-16 LAB — HEPATITIS B SURFACE ANTIGEN: Hepatitis B Surface Ag: NEGATIVE

## 2016-12-19 DIAGNOSIS — E119 Type 2 diabetes mellitus without complications: Secondary | ICD-10-CM | POA: Diagnosis not present

## 2016-12-19 DIAGNOSIS — I10 Essential (primary) hypertension: Secondary | ICD-10-CM | POA: Diagnosis not present

## 2016-12-28 ENCOUNTER — Telehealth: Payer: Self-pay | Admitting: Gastroenterology

## 2016-12-28 DIAGNOSIS — B182 Chronic viral hepatitis C: Secondary | ICD-10-CM

## 2016-12-28 NOTE — Telephone Encounter (Signed)
PLEASE CALL PT. WE ARE SUBMITTING HIS PAPERWORK FOR HEP C TREATMENT. HE NEEDS A HEP C LEVEL(QUANTITATIVE) CHECKED 4 WEEKS AFTER THERAPY STARTS. THE MEDS WILL BE DELIVERED TO OUR OFFICE FOR PICKUP.  HE NEED TO SEE DR. [place] AND RECEIVE THE TWINRIX VACCINE TO PROTECT AGAINST HEP A AND HEP B.

## 2017-01-02 NOTE — Telephone Encounter (Signed)
Pt is aware.  

## 2017-01-12 NOTE — Telephone Encounter (Signed)
Dr. Oneida Alar, pt's Spencer Mills was delivered to the office today. Can you please give instructions?

## 2017-01-16 DIAGNOSIS — I10 Essential (primary) hypertension: Secondary | ICD-10-CM | POA: Diagnosis not present

## 2017-01-16 DIAGNOSIS — E119 Type 2 diabetes mellitus without complications: Secondary | ICD-10-CM | POA: Diagnosis not present

## 2017-01-17 DIAGNOSIS — N181 Chronic kidney disease, stage 1: Secondary | ICD-10-CM | POA: Diagnosis not present

## 2017-01-17 DIAGNOSIS — K219 Gastro-esophageal reflux disease without esophagitis: Secondary | ICD-10-CM | POA: Diagnosis not present

## 2017-01-17 DIAGNOSIS — E7800 Pure hypercholesterolemia, unspecified: Secondary | ICD-10-CM | POA: Diagnosis not present

## 2017-01-17 DIAGNOSIS — Z6827 Body mass index (BMI) 27.0-27.9, adult: Secondary | ICD-10-CM | POA: Diagnosis not present

## 2017-01-17 DIAGNOSIS — E1165 Type 2 diabetes mellitus with hyperglycemia: Secondary | ICD-10-CM | POA: Diagnosis not present

## 2017-01-17 DIAGNOSIS — Z299 Encounter for prophylactic measures, unspecified: Secondary | ICD-10-CM | POA: Diagnosis not present

## 2017-01-17 DIAGNOSIS — E1122 Type 2 diabetes mellitus with diabetic chronic kidney disease: Secondary | ICD-10-CM | POA: Diagnosis not present

## 2017-01-17 DIAGNOSIS — N400 Enlarged prostate without lower urinary tract symptoms: Secondary | ICD-10-CM | POA: Diagnosis not present

## 2017-01-17 DIAGNOSIS — B1920 Unspecified viral hepatitis C without hepatic coma: Secondary | ICD-10-CM | POA: Diagnosis not present

## 2017-01-17 DIAGNOSIS — R972 Elevated prostate specific antigen [PSA]: Secondary | ICD-10-CM | POA: Diagnosis not present

## 2017-01-17 DIAGNOSIS — R768 Other specified abnormal immunological findings in serum: Secondary | ICD-10-CM | POA: Diagnosis not present

## 2017-01-18 ENCOUNTER — Other Ambulatory Visit: Payer: Self-pay | Admitting: Gastroenterology

## 2017-01-18 DIAGNOSIS — B182 Chronic viral hepatitis C: Secondary | ICD-10-CM

## 2017-01-18 NOTE — Telephone Encounter (Signed)
Pt is aware. Lab orders done.

## 2017-01-18 NOTE — Telephone Encounter (Signed)
Per AB- ok to give pt instructions to have blood work in 4 weeks and to not take any new Rx or otc medications without contacting us first. Letters done.

## 2017-01-23 ENCOUNTER — Other Ambulatory Visit: Payer: Self-pay

## 2017-01-23 DIAGNOSIS — B182 Chronic viral hepatitis C: Secondary | ICD-10-CM

## 2017-01-23 NOTE — Telephone Encounter (Signed)
REVIEWED-NO ADDITIONAL RECOMMENDATIONS. 

## 2017-01-31 ENCOUNTER — Encounter: Payer: Self-pay | Admitting: Gastroenterology

## 2017-02-16 DIAGNOSIS — E119 Type 2 diabetes mellitus without complications: Secondary | ICD-10-CM | POA: Diagnosis not present

## 2017-02-16 DIAGNOSIS — I10 Essential (primary) hypertension: Secondary | ICD-10-CM | POA: Diagnosis not present

## 2017-02-20 DIAGNOSIS — B182 Chronic viral hepatitis C: Secondary | ICD-10-CM | POA: Diagnosis not present

## 2017-02-22 ENCOUNTER — Telehealth: Payer: Self-pay | Admitting: Gastroenterology

## 2017-02-22 LAB — HEPATITIS C RNA QUANTITATIVE
HCV QUANT LOG: NOT DETECTED {Log_IU}/mL
HCV Quantitative: <15 IU/mL

## 2017-02-22 NOTE — Telephone Encounter (Signed)
Pt is aware.  

## 2017-02-22 NOTE — Telephone Encounter (Signed)
PLEASE CALL PT. HIS HEP C LEVEL IS UNDETECTABLE. HE SHOULD COMPLETE 12 WEEKS OF THERAPY.

## 2017-03-08 DIAGNOSIS — H2513 Age-related nuclear cataract, bilateral: Secondary | ICD-10-CM | POA: Diagnosis not present

## 2017-03-20 DIAGNOSIS — E119 Type 2 diabetes mellitus without complications: Secondary | ICD-10-CM | POA: Diagnosis not present

## 2017-03-20 DIAGNOSIS — I10 Essential (primary) hypertension: Secondary | ICD-10-CM | POA: Diagnosis not present

## 2017-03-28 DIAGNOSIS — Z79899 Other long term (current) drug therapy: Secondary | ICD-10-CM | POA: Diagnosis not present

## 2017-03-28 DIAGNOSIS — Z6826 Body mass index (BMI) 26.0-26.9, adult: Secondary | ICD-10-CM | POA: Diagnosis not present

## 2017-03-28 DIAGNOSIS — Z0000 Encounter for general adult medical examination without abnormal findings: Secondary | ICD-10-CM | POA: Diagnosis not present

## 2017-03-28 DIAGNOSIS — E1165 Type 2 diabetes mellitus with hyperglycemia: Secondary | ICD-10-CM | POA: Diagnosis not present

## 2017-03-28 DIAGNOSIS — Z7189 Other specified counseling: Secondary | ICD-10-CM | POA: Diagnosis not present

## 2017-03-28 DIAGNOSIS — R5383 Other fatigue: Secondary | ICD-10-CM | POA: Diagnosis not present

## 2017-03-28 DIAGNOSIS — Z1211 Encounter for screening for malignant neoplasm of colon: Secondary | ICD-10-CM | POA: Diagnosis not present

## 2017-03-28 DIAGNOSIS — Z125 Encounter for screening for malignant neoplasm of prostate: Secondary | ICD-10-CM | POA: Diagnosis not present

## 2017-03-28 DIAGNOSIS — K219 Gastro-esophageal reflux disease without esophagitis: Secondary | ICD-10-CM | POA: Diagnosis not present

## 2017-03-28 DIAGNOSIS — Z720 Tobacco use: Secondary | ICD-10-CM | POA: Diagnosis not present

## 2017-03-28 DIAGNOSIS — Z299 Encounter for prophylactic measures, unspecified: Secondary | ICD-10-CM | POA: Diagnosis not present

## 2017-03-28 DIAGNOSIS — E7800 Pure hypercholesterolemia, unspecified: Secondary | ICD-10-CM | POA: Diagnosis not present

## 2017-03-28 DIAGNOSIS — Z2821 Immunization not carried out because of patient refusal: Secondary | ICD-10-CM | POA: Diagnosis not present

## 2017-03-28 DIAGNOSIS — Z1389 Encounter for screening for other disorder: Secondary | ICD-10-CM | POA: Diagnosis not present

## 2017-03-28 DIAGNOSIS — F17200 Nicotine dependence, unspecified, uncomplicated: Secondary | ICD-10-CM | POA: Diagnosis not present

## 2017-06-29 DIAGNOSIS — B1920 Unspecified viral hepatitis C without hepatic coma: Secondary | ICD-10-CM | POA: Diagnosis not present

## 2017-06-29 DIAGNOSIS — M7542 Impingement syndrome of left shoulder: Secondary | ICD-10-CM | POA: Diagnosis not present

## 2017-06-29 DIAGNOSIS — I10 Essential (primary) hypertension: Secondary | ICD-10-CM | POA: Diagnosis not present

## 2017-06-29 DIAGNOSIS — E1165 Type 2 diabetes mellitus with hyperglycemia: Secondary | ICD-10-CM | POA: Diagnosis not present

## 2017-06-29 DIAGNOSIS — Z299 Encounter for prophylactic measures, unspecified: Secondary | ICD-10-CM | POA: Diagnosis not present

## 2017-06-29 DIAGNOSIS — N400 Enlarged prostate without lower urinary tract symptoms: Secondary | ICD-10-CM | POA: Diagnosis not present

## 2017-06-29 DIAGNOSIS — R972 Elevated prostate specific antigen [PSA]: Secondary | ICD-10-CM | POA: Diagnosis not present

## 2017-06-29 DIAGNOSIS — E663 Overweight: Secondary | ICD-10-CM | POA: Diagnosis not present

## 2017-06-29 DIAGNOSIS — E7800 Pure hypercholesterolemia, unspecified: Secondary | ICD-10-CM | POA: Diagnosis not present

## 2017-07-05 DIAGNOSIS — E119 Type 2 diabetes mellitus without complications: Secondary | ICD-10-CM | POA: Diagnosis not present

## 2017-07-05 DIAGNOSIS — I10 Essential (primary) hypertension: Secondary | ICD-10-CM | POA: Diagnosis not present

## 2017-07-06 DIAGNOSIS — Z299 Encounter for prophylactic measures, unspecified: Secondary | ICD-10-CM | POA: Diagnosis not present

## 2017-07-06 DIAGNOSIS — B079 Viral wart, unspecified: Secondary | ICD-10-CM | POA: Diagnosis not present

## 2017-07-06 DIAGNOSIS — E1165 Type 2 diabetes mellitus with hyperglycemia: Secondary | ICD-10-CM | POA: Diagnosis not present

## 2017-07-06 DIAGNOSIS — B1920 Unspecified viral hepatitis C without hepatic coma: Secondary | ICD-10-CM | POA: Diagnosis not present

## 2017-07-06 DIAGNOSIS — K219 Gastro-esophageal reflux disease without esophagitis: Secondary | ICD-10-CM | POA: Diagnosis not present

## 2017-07-06 DIAGNOSIS — Z6827 Body mass index (BMI) 27.0-27.9, adult: Secondary | ICD-10-CM | POA: Diagnosis not present

## 2017-07-06 DIAGNOSIS — I10 Essential (primary) hypertension: Secondary | ICD-10-CM | POA: Diagnosis not present

## 2017-10-05 DIAGNOSIS — Z713 Dietary counseling and surveillance: Secondary | ICD-10-CM | POA: Diagnosis not present

## 2017-10-05 DIAGNOSIS — Z6827 Body mass index (BMI) 27.0-27.9, adult: Secondary | ICD-10-CM | POA: Diagnosis not present

## 2017-10-05 DIAGNOSIS — I10 Essential (primary) hypertension: Secondary | ICD-10-CM | POA: Diagnosis not present

## 2017-10-05 DIAGNOSIS — E1165 Type 2 diabetes mellitus with hyperglycemia: Secondary | ICD-10-CM | POA: Diagnosis not present

## 2017-10-05 DIAGNOSIS — Z299 Encounter for prophylactic measures, unspecified: Secondary | ICD-10-CM | POA: Diagnosis not present

## 2017-10-30 DIAGNOSIS — E119 Type 2 diabetes mellitus without complications: Secondary | ICD-10-CM | POA: Diagnosis not present

## 2017-10-30 DIAGNOSIS — I10 Essential (primary) hypertension: Secondary | ICD-10-CM | POA: Diagnosis not present

## 2017-11-07 DIAGNOSIS — E119 Type 2 diabetes mellitus without complications: Secondary | ICD-10-CM | POA: Diagnosis not present

## 2017-11-07 DIAGNOSIS — I10 Essential (primary) hypertension: Secondary | ICD-10-CM | POA: Diagnosis not present

## 2018-01-02 DIAGNOSIS — I10 Essential (primary) hypertension: Secondary | ICD-10-CM | POA: Diagnosis not present

## 2018-01-02 DIAGNOSIS — E119 Type 2 diabetes mellitus without complications: Secondary | ICD-10-CM | POA: Diagnosis not present

## 2018-01-16 DIAGNOSIS — E1165 Type 2 diabetes mellitus with hyperglycemia: Secondary | ICD-10-CM | POA: Diagnosis not present

## 2018-01-16 DIAGNOSIS — Z299 Encounter for prophylactic measures, unspecified: Secondary | ICD-10-CM | POA: Diagnosis not present

## 2018-01-16 DIAGNOSIS — E7800 Pure hypercholesterolemia, unspecified: Secondary | ICD-10-CM | POA: Diagnosis not present

## 2018-01-16 DIAGNOSIS — Z6828 Body mass index (BMI) 28.0-28.9, adult: Secondary | ICD-10-CM | POA: Diagnosis not present

## 2018-01-16 DIAGNOSIS — I10 Essential (primary) hypertension: Secondary | ICD-10-CM | POA: Diagnosis not present

## 2018-02-16 IMAGING — NM NM MYOCAR MULTI W/SPECT W/WALL MOTION & EF
2 series · 12 of 12 positions shown · non-contrast
Comparison: none

[Series 1: rest · 8.28mm/px · 6 of 64 frames shown]
[frame 6/64]
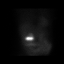
[frame 16/64]
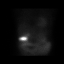
[frame 27/64]
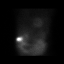
[frame 38/64]
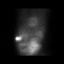
[frame 48/64]
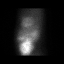
[frame 59/64]
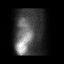

[Series 2: stress gated · 8.28mm/px · 6 of 64 frames shown]
[frame 6/64]
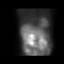
[frame 16/64]
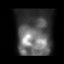
[frame 27/64]
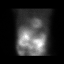
[frame 38/64]
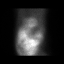
[frame 48/64]
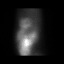
[frame 59/64]
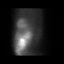

[12 of 12 positions shown; findings below may reference images not displayed]

Canned report from images found in remote index.

Refer to host system for actual result text.

## 2018-12-25 DIAGNOSIS — F17210 Nicotine dependence, cigarettes, uncomplicated: Secondary | ICD-10-CM | POA: Diagnosis not present

## 2018-12-25 DIAGNOSIS — Z299 Encounter for prophylactic measures, unspecified: Secondary | ICD-10-CM | POA: Diagnosis not present

## 2018-12-25 DIAGNOSIS — Z2821 Immunization not carried out because of patient refusal: Secondary | ICD-10-CM | POA: Diagnosis not present

## 2018-12-25 DIAGNOSIS — I10 Essential (primary) hypertension: Secondary | ICD-10-CM | POA: Diagnosis not present

## 2018-12-25 DIAGNOSIS — Z6827 Body mass index (BMI) 27.0-27.9, adult: Secondary | ICD-10-CM | POA: Diagnosis not present

## 2019-01-03 DIAGNOSIS — Z1339 Encounter for screening examination for other mental health and behavioral disorders: Secondary | ICD-10-CM | POA: Diagnosis not present

## 2019-01-03 DIAGNOSIS — Z125 Encounter for screening for malignant neoplasm of prostate: Secondary | ICD-10-CM | POA: Diagnosis not present

## 2019-01-03 DIAGNOSIS — Z0000 Encounter for general adult medical examination without abnormal findings: Secondary | ICD-10-CM | POA: Diagnosis not present

## 2019-01-03 DIAGNOSIS — E1165 Type 2 diabetes mellitus with hyperglycemia: Secondary | ICD-10-CM | POA: Diagnosis not present

## 2019-01-03 DIAGNOSIS — Z1211 Encounter for screening for malignant neoplasm of colon: Secondary | ICD-10-CM | POA: Diagnosis not present

## 2019-01-03 DIAGNOSIS — Z1331 Encounter for screening for depression: Secondary | ICD-10-CM | POA: Diagnosis not present

## 2019-01-03 DIAGNOSIS — R05 Cough: Secondary | ICD-10-CM | POA: Diagnosis not present

## 2019-01-03 DIAGNOSIS — Z299 Encounter for prophylactic measures, unspecified: Secondary | ICD-10-CM | POA: Diagnosis not present

## 2019-01-03 DIAGNOSIS — Z7189 Other specified counseling: Secondary | ICD-10-CM | POA: Diagnosis not present

## 2019-01-03 DIAGNOSIS — I10 Essential (primary) hypertension: Secondary | ICD-10-CM | POA: Diagnosis not present

## 2019-01-03 DIAGNOSIS — E7800 Pure hypercholesterolemia, unspecified: Secondary | ICD-10-CM | POA: Diagnosis not present

## 2019-01-03 DIAGNOSIS — Z79899 Other long term (current) drug therapy: Secondary | ICD-10-CM | POA: Diagnosis not present

## 2019-01-03 DIAGNOSIS — Z6827 Body mass index (BMI) 27.0-27.9, adult: Secondary | ICD-10-CM | POA: Diagnosis not present

## 2019-01-03 DIAGNOSIS — R5383 Other fatigue: Secondary | ICD-10-CM | POA: Diagnosis not present

## 2019-01-09 DIAGNOSIS — R972 Elevated prostate specific antigen [PSA]: Secondary | ICD-10-CM | POA: Diagnosis not present

## 2019-01-09 DIAGNOSIS — Z6827 Body mass index (BMI) 27.0-27.9, adult: Secondary | ICD-10-CM | POA: Diagnosis not present

## 2019-01-09 DIAGNOSIS — I10 Essential (primary) hypertension: Secondary | ICD-10-CM | POA: Diagnosis not present

## 2019-01-09 DIAGNOSIS — Z299 Encounter for prophylactic measures, unspecified: Secondary | ICD-10-CM | POA: Diagnosis not present

## 2019-01-09 DIAGNOSIS — E1165 Type 2 diabetes mellitus with hyperglycemia: Secondary | ICD-10-CM | POA: Diagnosis not present

## 2019-01-09 DIAGNOSIS — E7800 Pure hypercholesterolemia, unspecified: Secondary | ICD-10-CM | POA: Diagnosis not present

## 2019-02-11 DIAGNOSIS — E7800 Pure hypercholesterolemia, unspecified: Secondary | ICD-10-CM | POA: Diagnosis not present

## 2019-02-11 DIAGNOSIS — I10 Essential (primary) hypertension: Secondary | ICD-10-CM | POA: Diagnosis not present

## 2019-02-11 DIAGNOSIS — E1165 Type 2 diabetes mellitus with hyperglycemia: Secondary | ICD-10-CM | POA: Diagnosis not present

## 2019-02-11 DIAGNOSIS — Z6827 Body mass index (BMI) 27.0-27.9, adult: Secondary | ICD-10-CM | POA: Diagnosis not present

## 2019-02-11 DIAGNOSIS — Z299 Encounter for prophylactic measures, unspecified: Secondary | ICD-10-CM | POA: Diagnosis not present

## 2019-02-11 DIAGNOSIS — R972 Elevated prostate specific antigen [PSA]: Secondary | ICD-10-CM | POA: Diagnosis not present

## 2019-02-11 DIAGNOSIS — N400 Enlarged prostate without lower urinary tract symptoms: Secondary | ICD-10-CM | POA: Diagnosis not present

## 2019-02-15 DIAGNOSIS — I10 Essential (primary) hypertension: Secondary | ICD-10-CM | POA: Diagnosis not present

## 2019-02-15 DIAGNOSIS — Z299 Encounter for prophylactic measures, unspecified: Secondary | ICD-10-CM | POA: Diagnosis not present

## 2019-02-15 DIAGNOSIS — Z6827 Body mass index (BMI) 27.0-27.9, adult: Secondary | ICD-10-CM | POA: Diagnosis not present

## 2019-02-26 DIAGNOSIS — I10 Essential (primary) hypertension: Secondary | ICD-10-CM | POA: Diagnosis not present

## 2019-03-15 DIAGNOSIS — Z6827 Body mass index (BMI) 27.0-27.9, adult: Secondary | ICD-10-CM | POA: Diagnosis not present

## 2019-03-15 DIAGNOSIS — F17210 Nicotine dependence, cigarettes, uncomplicated: Secondary | ICD-10-CM | POA: Diagnosis not present

## 2019-03-15 DIAGNOSIS — E1165 Type 2 diabetes mellitus with hyperglycemia: Secondary | ICD-10-CM | POA: Diagnosis not present

## 2019-03-15 DIAGNOSIS — Z299 Encounter for prophylactic measures, unspecified: Secondary | ICD-10-CM | POA: Diagnosis not present

## 2019-03-15 DIAGNOSIS — I10 Essential (primary) hypertension: Secondary | ICD-10-CM | POA: Diagnosis not present

## 2019-03-27 DIAGNOSIS — I10 Essential (primary) hypertension: Secondary | ICD-10-CM | POA: Diagnosis not present

## 2019-04-10 DIAGNOSIS — E1165 Type 2 diabetes mellitus with hyperglycemia: Secondary | ICD-10-CM | POA: Diagnosis not present

## 2019-04-10 DIAGNOSIS — Z299 Encounter for prophylactic measures, unspecified: Secondary | ICD-10-CM | POA: Diagnosis not present

## 2019-04-10 DIAGNOSIS — I10 Essential (primary) hypertension: Secondary | ICD-10-CM | POA: Diagnosis not present

## 2019-04-10 DIAGNOSIS — Z6827 Body mass index (BMI) 27.0-27.9, adult: Secondary | ICD-10-CM | POA: Diagnosis not present

## 2019-04-10 DIAGNOSIS — R972 Elevated prostate specific antigen [PSA]: Secondary | ICD-10-CM | POA: Diagnosis not present

## 2019-04-10 DIAGNOSIS — L600 Ingrowing nail: Secondary | ICD-10-CM | POA: Diagnosis not present

## 2019-04-17 DIAGNOSIS — L600 Ingrowing nail: Secondary | ICD-10-CM | POA: Diagnosis not present

## 2019-04-17 DIAGNOSIS — M79674 Pain in right toe(s): Secondary | ICD-10-CM | POA: Diagnosis not present

## 2019-04-17 DIAGNOSIS — M79671 Pain in right foot: Secondary | ICD-10-CM | POA: Diagnosis not present

## 2019-05-08 DIAGNOSIS — L600 Ingrowing nail: Secondary | ICD-10-CM | POA: Diagnosis not present

## 2019-05-08 DIAGNOSIS — M79674 Pain in right toe(s): Secondary | ICD-10-CM | POA: Diagnosis not present

## 2019-05-08 DIAGNOSIS — L03031 Cellulitis of right toe: Secondary | ICD-10-CM | POA: Diagnosis not present

## 2019-05-08 DIAGNOSIS — M79671 Pain in right foot: Secondary | ICD-10-CM | POA: Diagnosis not present

## 2019-05-21 DIAGNOSIS — L600 Ingrowing nail: Secondary | ICD-10-CM | POA: Diagnosis not present

## 2019-05-21 DIAGNOSIS — M79674 Pain in right toe(s): Secondary | ICD-10-CM | POA: Diagnosis not present

## 2019-05-21 DIAGNOSIS — M79671 Pain in right foot: Secondary | ICD-10-CM | POA: Diagnosis not present

## 2019-05-21 DIAGNOSIS — L03031 Cellulitis of right toe: Secondary | ICD-10-CM | POA: Diagnosis not present

## 2019-05-27 DIAGNOSIS — I10 Essential (primary) hypertension: Secondary | ICD-10-CM | POA: Diagnosis not present

## 2019-06-12 ENCOUNTER — Ambulatory Visit (INDEPENDENT_AMBULATORY_CARE_PROVIDER_SITE_OTHER): Payer: PPO | Admitting: Urology

## 2019-06-12 ENCOUNTER — Other Ambulatory Visit: Payer: Self-pay

## 2019-06-12 DIAGNOSIS — R972 Elevated prostate specific antigen [PSA]: Secondary | ICD-10-CM

## 2019-07-01 DIAGNOSIS — I10 Essential (primary) hypertension: Secondary | ICD-10-CM | POA: Diagnosis not present

## 2019-07-04 DIAGNOSIS — M79671 Pain in right foot: Secondary | ICD-10-CM | POA: Diagnosis not present

## 2019-07-04 DIAGNOSIS — L03031 Cellulitis of right toe: Secondary | ICD-10-CM | POA: Diagnosis not present

## 2019-07-04 DIAGNOSIS — M79674 Pain in right toe(s): Secondary | ICD-10-CM | POA: Diagnosis not present

## 2019-07-04 DIAGNOSIS — L600 Ingrowing nail: Secondary | ICD-10-CM | POA: Diagnosis not present

## 2019-07-09 DIAGNOSIS — N182 Chronic kidney disease, stage 2 (mild): Secondary | ICD-10-CM | POA: Diagnosis not present

## 2019-07-09 DIAGNOSIS — Z299 Encounter for prophylactic measures, unspecified: Secondary | ICD-10-CM | POA: Diagnosis not present

## 2019-07-09 DIAGNOSIS — E1165 Type 2 diabetes mellitus with hyperglycemia: Secondary | ICD-10-CM | POA: Diagnosis not present

## 2019-07-09 DIAGNOSIS — F17210 Nicotine dependence, cigarettes, uncomplicated: Secondary | ICD-10-CM | POA: Diagnosis not present

## 2019-07-09 DIAGNOSIS — I10 Essential (primary) hypertension: Secondary | ICD-10-CM | POA: Diagnosis not present

## 2019-07-09 DIAGNOSIS — Z6827 Body mass index (BMI) 27.0-27.9, adult: Secondary | ICD-10-CM | POA: Diagnosis not present

## 2019-07-16 DIAGNOSIS — M79674 Pain in right toe(s): Secondary | ICD-10-CM | POA: Diagnosis not present

## 2019-07-16 DIAGNOSIS — M79671 Pain in right foot: Secondary | ICD-10-CM | POA: Diagnosis not present

## 2019-07-16 DIAGNOSIS — L600 Ingrowing nail: Secondary | ICD-10-CM | POA: Diagnosis not present

## 2019-07-16 DIAGNOSIS — L03031 Cellulitis of right toe: Secondary | ICD-10-CM | POA: Diagnosis not present

## 2019-07-17 DIAGNOSIS — R972 Elevated prostate specific antigen [PSA]: Secondary | ICD-10-CM | POA: Diagnosis not present

## 2019-07-17 DIAGNOSIS — Z299 Encounter for prophylactic measures, unspecified: Secondary | ICD-10-CM | POA: Diagnosis not present

## 2019-07-17 DIAGNOSIS — I10 Essential (primary) hypertension: Secondary | ICD-10-CM | POA: Diagnosis not present

## 2019-07-17 DIAGNOSIS — E1165 Type 2 diabetes mellitus with hyperglycemia: Secondary | ICD-10-CM | POA: Diagnosis not present

## 2019-07-17 DIAGNOSIS — Z6827 Body mass index (BMI) 27.0-27.9, adult: Secondary | ICD-10-CM | POA: Diagnosis not present

## 2019-08-28 DIAGNOSIS — I10 Essential (primary) hypertension: Secondary | ICD-10-CM | POA: Diagnosis not present

## 2019-09-11 ENCOUNTER — Ambulatory Visit: Payer: PPO | Admitting: Urology

## 2019-09-30 DIAGNOSIS — I10 Essential (primary) hypertension: Secondary | ICD-10-CM | POA: Diagnosis not present

## 2019-10-22 DIAGNOSIS — Z299 Encounter for prophylactic measures, unspecified: Secondary | ICD-10-CM | POA: Diagnosis not present

## 2019-10-22 DIAGNOSIS — I10 Essential (primary) hypertension: Secondary | ICD-10-CM | POA: Diagnosis not present

## 2019-10-22 DIAGNOSIS — Z2821 Immunization not carried out because of patient refusal: Secondary | ICD-10-CM | POA: Diagnosis not present

## 2019-10-22 DIAGNOSIS — E1165 Type 2 diabetes mellitus with hyperglycemia: Secondary | ICD-10-CM | POA: Diagnosis not present

## 2019-10-22 DIAGNOSIS — Z6827 Body mass index (BMI) 27.0-27.9, adult: Secondary | ICD-10-CM | POA: Diagnosis not present

## 2019-10-23 DIAGNOSIS — I10 Essential (primary) hypertension: Secondary | ICD-10-CM | POA: Diagnosis not present

## 2019-11-26 DIAGNOSIS — I10 Essential (primary) hypertension: Secondary | ICD-10-CM | POA: Diagnosis not present

## 2019-12-11 DIAGNOSIS — M79672 Pain in left foot: Secondary | ICD-10-CM | POA: Diagnosis not present

## 2019-12-11 DIAGNOSIS — M79675 Pain in left toe(s): Secondary | ICD-10-CM | POA: Diagnosis not present

## 2019-12-11 DIAGNOSIS — B351 Tinea unguium: Secondary | ICD-10-CM | POA: Diagnosis not present

## 2019-12-11 DIAGNOSIS — M79674 Pain in right toe(s): Secondary | ICD-10-CM | POA: Diagnosis not present

## 2019-12-11 DIAGNOSIS — M79671 Pain in right foot: Secondary | ICD-10-CM | POA: Diagnosis not present

## 2019-12-13 ENCOUNTER — Other Ambulatory Visit: Payer: Self-pay

## 2019-12-13 ENCOUNTER — Ambulatory Visit: Payer: PPO | Attending: Internal Medicine

## 2019-12-13 DIAGNOSIS — Z20822 Contact with and (suspected) exposure to covid-19: Secondary | ICD-10-CM | POA: Insufficient documentation

## 2019-12-14 ENCOUNTER — Ambulatory Visit: Payer: PPO

## 2019-12-17 LAB — NOVEL CORONAVIRUS, NAA: SARS-CoV-2, NAA: NOT DETECTED

## 2019-12-29 DIAGNOSIS — I10 Essential (primary) hypertension: Secondary | ICD-10-CM | POA: Diagnosis not present

## 2020-01-07 DIAGNOSIS — Z0000 Encounter for general adult medical examination without abnormal findings: Secondary | ICD-10-CM | POA: Diagnosis not present

## 2020-01-07 DIAGNOSIS — E7800 Pure hypercholesterolemia, unspecified: Secondary | ICD-10-CM | POA: Diagnosis not present

## 2020-01-07 DIAGNOSIS — E1165 Type 2 diabetes mellitus with hyperglycemia: Secondary | ICD-10-CM | POA: Diagnosis not present

## 2020-01-07 DIAGNOSIS — Z1211 Encounter for screening for malignant neoplasm of colon: Secondary | ICD-10-CM | POA: Diagnosis not present

## 2020-01-07 DIAGNOSIS — Z1339 Encounter for screening examination for other mental health and behavioral disorders: Secondary | ICD-10-CM | POA: Diagnosis not present

## 2020-01-07 DIAGNOSIS — Z6828 Body mass index (BMI) 28.0-28.9, adult: Secondary | ICD-10-CM | POA: Diagnosis not present

## 2020-01-07 DIAGNOSIS — R5383 Other fatigue: Secondary | ICD-10-CM | POA: Diagnosis not present

## 2020-01-07 DIAGNOSIS — Z1331 Encounter for screening for depression: Secondary | ICD-10-CM | POA: Diagnosis not present

## 2020-01-07 DIAGNOSIS — Z7189 Other specified counseling: Secondary | ICD-10-CM | POA: Diagnosis not present

## 2020-01-07 DIAGNOSIS — Z125 Encounter for screening for malignant neoplasm of prostate: Secondary | ICD-10-CM | POA: Diagnosis not present

## 2020-01-07 DIAGNOSIS — Z79899 Other long term (current) drug therapy: Secondary | ICD-10-CM | POA: Diagnosis not present

## 2020-01-07 DIAGNOSIS — Z299 Encounter for prophylactic measures, unspecified: Secondary | ICD-10-CM | POA: Diagnosis not present

## 2020-01-08 DIAGNOSIS — M79671 Pain in right foot: Secondary | ICD-10-CM | POA: Diagnosis not present

## 2020-01-08 DIAGNOSIS — M79672 Pain in left foot: Secondary | ICD-10-CM | POA: Diagnosis not present

## 2020-01-08 DIAGNOSIS — M79674 Pain in right toe(s): Secondary | ICD-10-CM | POA: Diagnosis not present

## 2020-01-08 DIAGNOSIS — M79675 Pain in left toe(s): Secondary | ICD-10-CM | POA: Diagnosis not present

## 2020-01-08 DIAGNOSIS — B351 Tinea unguium: Secondary | ICD-10-CM | POA: Diagnosis not present

## 2020-01-14 DIAGNOSIS — Z6828 Body mass index (BMI) 28.0-28.9, adult: Secondary | ICD-10-CM | POA: Diagnosis not present

## 2020-01-14 DIAGNOSIS — I10 Essential (primary) hypertension: Secondary | ICD-10-CM | POA: Diagnosis not present

## 2020-01-14 DIAGNOSIS — R809 Proteinuria, unspecified: Secondary | ICD-10-CM | POA: Diagnosis not present

## 2020-01-14 DIAGNOSIS — D72829 Elevated white blood cell count, unspecified: Secondary | ICD-10-CM | POA: Diagnosis not present

## 2020-01-14 DIAGNOSIS — F17210 Nicotine dependence, cigarettes, uncomplicated: Secondary | ICD-10-CM | POA: Diagnosis not present

## 2020-01-14 DIAGNOSIS — E1165 Type 2 diabetes mellitus with hyperglycemia: Secondary | ICD-10-CM | POA: Diagnosis not present

## 2020-01-14 DIAGNOSIS — E1129 Type 2 diabetes mellitus with other diabetic kidney complication: Secondary | ICD-10-CM | POA: Diagnosis not present

## 2020-01-14 DIAGNOSIS — Z299 Encounter for prophylactic measures, unspecified: Secondary | ICD-10-CM | POA: Diagnosis not present

## 2020-01-21 DIAGNOSIS — E1122 Type 2 diabetes mellitus with diabetic chronic kidney disease: Secondary | ICD-10-CM | POA: Diagnosis not present

## 2020-01-21 DIAGNOSIS — D72828 Other elevated white blood cell count: Secondary | ICD-10-CM | POA: Diagnosis not present

## 2020-01-21 DIAGNOSIS — R972 Elevated prostate specific antigen [PSA]: Secondary | ICD-10-CM | POA: Diagnosis not present

## 2020-01-21 DIAGNOSIS — B182 Chronic viral hepatitis C: Secondary | ICD-10-CM | POA: Diagnosis not present

## 2020-01-21 DIAGNOSIS — Z794 Long term (current) use of insulin: Secondary | ICD-10-CM | POA: Diagnosis not present

## 2020-01-21 DIAGNOSIS — Z125 Encounter for screening for malignant neoplasm of prostate: Secondary | ICD-10-CM | POA: Diagnosis not present

## 2020-01-21 DIAGNOSIS — N183 Chronic kidney disease, stage 3 unspecified: Secondary | ICD-10-CM | POA: Diagnosis not present

## 2020-01-27 DIAGNOSIS — I10 Essential (primary) hypertension: Secondary | ICD-10-CM | POA: Diagnosis not present

## 2020-01-27 DIAGNOSIS — D72829 Elevated white blood cell count, unspecified: Secondary | ICD-10-CM | POA: Diagnosis not present

## 2020-01-27 DIAGNOSIS — E1165 Type 2 diabetes mellitus with hyperglycemia: Secondary | ICD-10-CM | POA: Diagnosis not present

## 2020-01-27 DIAGNOSIS — F17210 Nicotine dependence, cigarettes, uncomplicated: Secondary | ICD-10-CM | POA: Diagnosis not present

## 2020-01-27 DIAGNOSIS — Z299 Encounter for prophylactic measures, unspecified: Secondary | ICD-10-CM | POA: Diagnosis not present

## 2020-01-27 DIAGNOSIS — Z6827 Body mass index (BMI) 27.0-27.9, adult: Secondary | ICD-10-CM | POA: Diagnosis not present

## 2020-01-28 DIAGNOSIS — I10 Essential (primary) hypertension: Secondary | ICD-10-CM | POA: Diagnosis not present

## 2020-02-05 DIAGNOSIS — Z794 Long term (current) use of insulin: Secondary | ICD-10-CM | POA: Diagnosis not present

## 2020-02-05 DIAGNOSIS — Z1329 Encounter for screening for other suspected endocrine disorder: Secondary | ICD-10-CM | POA: Diagnosis not present

## 2020-02-05 DIAGNOSIS — B182 Chronic viral hepatitis C: Secondary | ICD-10-CM | POA: Diagnosis not present

## 2020-02-05 DIAGNOSIS — N183 Chronic kidney disease, stage 3 unspecified: Secondary | ICD-10-CM | POA: Diagnosis not present

## 2020-02-05 DIAGNOSIS — R972 Elevated prostate specific antigen [PSA]: Secondary | ICD-10-CM | POA: Diagnosis not present

## 2020-02-05 DIAGNOSIS — E1122 Type 2 diabetes mellitus with diabetic chronic kidney disease: Secondary | ICD-10-CM | POA: Diagnosis not present

## 2020-02-05 DIAGNOSIS — D72828 Other elevated white blood cell count: Secondary | ICD-10-CM | POA: Diagnosis not present

## 2020-02-28 DIAGNOSIS — I10 Essential (primary) hypertension: Secondary | ICD-10-CM | POA: Diagnosis not present

## 2020-03-29 DIAGNOSIS — I10 Essential (primary) hypertension: Secondary | ICD-10-CM | POA: Diagnosis not present

## 2020-03-31 DIAGNOSIS — B182 Chronic viral hepatitis C: Secondary | ICD-10-CM | POA: Diagnosis not present

## 2020-03-31 DIAGNOSIS — D72828 Other elevated white blood cell count: Secondary | ICD-10-CM | POA: Diagnosis not present

## 2020-03-31 DIAGNOSIS — R972 Elevated prostate specific antigen [PSA]: Secondary | ICD-10-CM | POA: Diagnosis not present

## 2020-04-29 DIAGNOSIS — I10 Essential (primary) hypertension: Secondary | ICD-10-CM | POA: Diagnosis not present

## 2020-05-05 ENCOUNTER — Other Ambulatory Visit: Payer: Self-pay

## 2020-05-05 ENCOUNTER — Ambulatory Visit
Admission: EM | Admit: 2020-05-05 | Discharge: 2020-05-05 | Disposition: A | Discharge status: Home / Self Care | Payer: PPO | Attending: Emergency Medicine | Admitting: Emergency Medicine

## 2020-05-05 ENCOUNTER — Ambulatory Visit (INDEPENDENT_AMBULATORY_CARE_PROVIDER_SITE_OTHER): Payer: PPO

## 2020-05-05 ENCOUNTER — Encounter: Payer: Self-pay | Admitting: Emergency Medicine

## 2020-05-05 DIAGNOSIS — M25532 Pain in left wrist: Secondary | ICD-10-CM

## 2020-05-05 DIAGNOSIS — S6992XA Unspecified injury of left wrist, hand and finger(s), initial encounter: Secondary | ICD-10-CM | POA: Diagnosis not present

## 2020-05-05 DIAGNOSIS — W19XXXA Unspecified fall, initial encounter: Secondary | ICD-10-CM | POA: Diagnosis not present

## 2020-05-05 DIAGNOSIS — M79645 Pain in left finger(s): Secondary | ICD-10-CM | POA: Diagnosis not present

## 2020-05-05 DIAGNOSIS — M7989 Other specified soft tissue disorders: Secondary | ICD-10-CM | POA: Diagnosis not present

## 2020-05-05 MED ORDER — NAPROXEN 375 MG PO TABS: 375.0000 mg | ORAL_TABLET | Freq: Two times a day (BID) | ORAL | 0 refills | Status: DC

## 2020-05-05 NOTE — Discharge Instructions (Signed)
X-rays negative for fracture or dislocation Continue conservative management of rest, ice, and elevation Thumb spica brace applied Take naproxen as needed for pain relief (may cause abdominal discomfort, ulcers, and GI bleeds avoid taking with other NSAIDs) Follow up with orthopedist for recheck and to ensure symptoms are improving Return or go to the ER if you have any new or worsening symptoms (fever, chills, chest pain, redness, swelling, deformity, bruising, etc...)

## 2020-05-05 NOTE — ED Triage Notes (Signed)
Pt seen by provider prior to RN assessment.

## 2020-05-05 NOTE — ED Provider Notes (Signed)
[place]   [id number] 05/05/20 Arrival Time: 5009  CC: LT wrist pain  SUBJECTIVE: History from: patient. Spencer Mills is a 69 y.o. male complains of LT wrist pain that occurred today.  Fall and landed on LT arm.  Fell down apx 5 steps while carrying trash.  Denies hitting head, or LOC.  Localizes the pain to the outside of LT wrist.  Describes the pain as intermittent and achy in character.  Denies alleviating factors.  Symptoms are made worse with ROM.  Complains of associated swelling.  Denies fever, chills, erythema, ecchymosis, weakness, numbness and tingling, SOB.      ROS: As per HPI.  All other pertinent ROS negative.     Past Medical History:  Diagnosis Date  . Diabetes mellitus without complication ([place])   . Hep C w/o coma, chronic ([place]) 08/2016   History reviewed. No pertinent surgical history. No Known Allergies No current facility-administered medications on file prior to encounter.   Current Outpatient Medications on File Prior to Encounter  Medication Sig Dispense Refill  . amLODipine (NORVASC) 5 MG tablet Take 5 mg by mouth daily.    . simvastatin (ZOCOR) 5 MG tablet Take 5 mg by mouth daily.    Marland Kitchen aspirin EC 81 MG tablet Take 81 mg by mouth daily. Pt takes occasionally    . lisinopril (PRINIVIL,ZESTRIL) 5 MG tablet Take 5 mg by mouth daily.     Social History   Socioeconomic History  . Marital status: Married    Spouse name: Not on file  . Number of children: Not on file  . Years of education: Not on file  . Highest education level: Not on file  Occupational History  . Not on file  Tobacco Use  . Smoking status: Current Every Day Smoker    Packs/day: 1.00    Types: Cigarettes    Start date: 04/29/1966  . Smokeless tobacco: Never Used  Substance and Sexual Activity  . Alcohol use: Yes    Alcohol/week: 0.0 standard drinks  . Drug use: Not on file  . Sexual activity: Not on file  Other Topics Concern  . Not on file  Social History  Narrative  . Not on file   Social Determinants of Health   Financial Resource Strain:   . Difficulty of Paying Living Expenses:   Food Insecurity:   . Worried About Charity fundraiser in the Last Year:   . Arboriculturist in the Last Year:   Transportation Needs:   . Film/video editor (Medical):   Marland Kitchen Lack of Transportation (Non-Medical):   Physical Activity:   . Days of Exercise per Week:   . Minutes of Exercise per Session:   Stress:   . Feeling of Stress :   Social Connections:   . Frequency of Communication with Friends and Family:   . Frequency of Social Gatherings with Friends and Family:   . Attends Religious Services:   . Active Member of Clubs or Organizations:   . Attends Archivist Meetings:   Marland Kitchen Marital Status:   Intimate Partner Violence:   . Fear of Current or Ex-Partner:   . Emotionally Abused:   Marland Kitchen Physically Abused:   . Sexually Abused:    History reviewed. No pertinent family history.  OBJECTIVE:  Vitals:   05/05/20 1515  BP: 118/62  Pulse: (!) 55  Resp: 17  Temp: 98.5 F (36.9 C)  TempSrc: Oral  SpO2: 98%    General  appearance: ALERT; in no acute distress.  Head: NCAT Lungs: Normal respiratory effort CV: Radial pulse 2+ . Cap refill < 2 seconds Musculoskeletal: LT shoulder, LT wrist Inspection: Skin warm, dry, clear and intact without obvious erythema, effusion, or ecchymosis.  Palpation: TTP over LT superior trapezius; TTP over snuff box; NTTP over LT lateral ribs or with AP chest compression ROM: LROM about the wrist Strength: shoulder abduction 5/5, shoulder adduction 5/5, grip strength deferred Skin: warm and dry Neurologic: Ambulates without difficulty; Sensation intact about the upper extremities Psychological: alert and cooperative; normal mood and affect  DIAGNOSTIC STUDIES:  DG Wrist Complete Left  Result Date: 05/05/2020 CLINICAL DATA:  Status post fall. EXAM: LEFT WRIST - COMPLETE 3+ VIEW COMPARISON:  None.  FINDINGS: There is no evidence of fracture or dislocation. There is no evidence of significant arthropathy or other focal bone abnormality. Mild soft tissue swelling is seen along the dorsal aspect of the left wrist. IMPRESSION: Mild dorsal soft tissue swelling without evidence of an acute osseous abnormality. Electronically Signed   By: Virgina Norfolk M.D.   On: 05/05/2020 15:31    X-rays negative for bony abnormalities including fracture, or dislocation.    I have reviewed the x-rays myself and the radiologist interpretation. I am in agreement with the radiologist interpretation.     ASSESSMENT & PLAN:  1. Left wrist pain   2. Thumb pain, left   3. Injury of left wrist, initial encounter     Meds ordered this encounter  Medications  . naproxen (NAPROSYN) 375 MG tablet    Sig: Take 1 tablet (375 mg total) by mouth 2 (two) times daily.    Dispense:  20 tablet    Refill:  0    Order Specific Question:   Supervising Provider    Answer:   Raylene Everts [[id number]]   X-rays negative for fracture or dislocation Continue conservative management of rest, ice, and elevation Thumb spica brace applied Take naproxen as needed for pain relief (may cause abdominal discomfort, ulcers, and GI bleeds avoid taking with other NSAIDs) Follow up with orthopedist for recheck and to ensure symptoms are improving Return or go to the ER if you have any new or worsening symptoms (fever, chills, chest pain, redness, swelling, deformity, bruising, etc...)   Reviewed expectations re: course of current medical issues. Questions answered. Outlined signs and symptoms indicating need for more acute intervention. Patient verbalized understanding. After Visit Summary given.    Lestine Box, PA-C 05/05/20 1540

## 2020-05-14 DIAGNOSIS — R079 Chest pain, unspecified: Secondary | ICD-10-CM | POA: Diagnosis not present

## 2020-05-14 DIAGNOSIS — F17210 Nicotine dependence, cigarettes, uncomplicated: Secondary | ICD-10-CM | POA: Diagnosis not present

## 2020-05-14 DIAGNOSIS — I10 Essential (primary) hypertension: Secondary | ICD-10-CM | POA: Diagnosis not present

## 2020-05-14 DIAGNOSIS — Z299 Encounter for prophylactic measures, unspecified: Secondary | ICD-10-CM | POA: Diagnosis not present

## 2020-05-14 DIAGNOSIS — M25532 Pain in left wrist: Secondary | ICD-10-CM | POA: Diagnosis not present

## 2020-05-14 DIAGNOSIS — E1122 Type 2 diabetes mellitus with diabetic chronic kidney disease: Secondary | ICD-10-CM | POA: Diagnosis not present

## 2020-05-14 DIAGNOSIS — E1165 Type 2 diabetes mellitus with hyperglycemia: Secondary | ICD-10-CM | POA: Diagnosis not present

## 2020-05-21 ENCOUNTER — Ambulatory Visit: Payer: PPO | Admitting: Urology

## 2020-05-29 DIAGNOSIS — I10 Essential (primary) hypertension: Secondary | ICD-10-CM | POA: Diagnosis not present

## 2020-06-15 DIAGNOSIS — E1165 Type 2 diabetes mellitus with hyperglycemia: Secondary | ICD-10-CM | POA: Diagnosis not present

## 2020-06-15 DIAGNOSIS — Z299 Encounter for prophylactic measures, unspecified: Secondary | ICD-10-CM | POA: Diagnosis not present

## 2020-06-15 DIAGNOSIS — Z6828 Body mass index (BMI) 28.0-28.9, adult: Secondary | ICD-10-CM | POA: Diagnosis not present

## 2020-06-15 DIAGNOSIS — Z713 Dietary counseling and surveillance: Secondary | ICD-10-CM | POA: Diagnosis not present

## 2020-06-15 DIAGNOSIS — I10 Essential (primary) hypertension: Secondary | ICD-10-CM | POA: Diagnosis not present

## 2020-06-30 DIAGNOSIS — I10 Essential (primary) hypertension: Secondary | ICD-10-CM | POA: Diagnosis not present

## 2020-07-30 DIAGNOSIS — I10 Essential (primary) hypertension: Secondary | ICD-10-CM | POA: Diagnosis not present

## 2020-08-11 DIAGNOSIS — Z6827 Body mass index (BMI) 27.0-27.9, adult: Secondary | ICD-10-CM | POA: Diagnosis not present

## 2020-08-11 DIAGNOSIS — R809 Proteinuria, unspecified: Secondary | ICD-10-CM | POA: Diagnosis not present

## 2020-08-11 DIAGNOSIS — Z299 Encounter for prophylactic measures, unspecified: Secondary | ICD-10-CM | POA: Diagnosis not present

## 2020-08-11 DIAGNOSIS — R202 Paresthesia of skin: Secondary | ICD-10-CM | POA: Diagnosis not present

## 2020-08-11 DIAGNOSIS — E1129 Type 2 diabetes mellitus with other diabetic kidney complication: Secondary | ICD-10-CM | POA: Diagnosis not present

## 2020-08-11 DIAGNOSIS — E7800 Pure hypercholesterolemia, unspecified: Secondary | ICD-10-CM | POA: Diagnosis not present

## 2020-08-19 DIAGNOSIS — I10 Essential (primary) hypertension: Secondary | ICD-10-CM | POA: Diagnosis not present

## 2020-08-19 DIAGNOSIS — E1165 Type 2 diabetes mellitus with hyperglycemia: Secondary | ICD-10-CM | POA: Diagnosis not present

## 2020-08-19 DIAGNOSIS — Z23 Encounter for immunization: Secondary | ICD-10-CM | POA: Diagnosis not present

## 2020-08-19 DIAGNOSIS — M7061 Trochanteric bursitis, right hip: Secondary | ICD-10-CM | POA: Diagnosis not present

## 2020-08-19 DIAGNOSIS — F17210 Nicotine dependence, cigarettes, uncomplicated: Secondary | ICD-10-CM | POA: Diagnosis not present

## 2020-08-19 DIAGNOSIS — Z299 Encounter for prophylactic measures, unspecified: Secondary | ICD-10-CM | POA: Diagnosis not present

## 2020-08-29 DIAGNOSIS — I10 Essential (primary) hypertension: Secondary | ICD-10-CM | POA: Diagnosis not present

## 2020-09-12 ENCOUNTER — Other Ambulatory Visit: Payer: Self-pay

## 2020-09-12 ENCOUNTER — Ambulatory Visit
Admission: EM | Admit: 2020-09-12 | Discharge: 2020-09-12 | Disposition: A | Discharge status: Home / Self Care | Payer: PPO | Attending: Emergency Medicine | Admitting: Emergency Medicine

## 2020-09-12 DIAGNOSIS — M25559 Pain in unspecified hip: Secondary | ICD-10-CM

## 2020-09-12 MED ORDER — DEXAMETHASONE SODIUM PHOSPHATE 10 MG/ML IJ SOLN
10.0000 mg | Freq: Once | INTRAMUSCULAR | Status: AC
  Administered 2020-09-12: 10 mg via INTRAMUSCULAR

## 2020-09-12 MED ORDER — PREDNISONE 20 MG PO TABS: 20.0000 mg | ORAL_TABLET | Freq: Two times a day (BID) | ORAL | 0 refills | Status: AC

## 2020-09-12 NOTE — Discharge Instructions (Signed)
Steroid shot given in office Continue conservative management of rest, ice, and gentle stretches Prednisone prescribed.  Take as directed and to completion Follow up with PCP for further evaluation and management Return or go to the ER if you have any new or worsening symptoms (fever, chills, chest pain, redness, swelling, bruising, deformity, etc...)

## 2020-09-12 NOTE — ED Provider Notes (Signed)
[place]   [id number] 09/12/20 Arrival Time: 4098  CC: RT hip PAIN  SUBJECTIVE: History from: patient. Spencer Mills is a 69 y.o. male complains of RT hip pain x 6 weeks.  Denies a precipitating event or specific injury.  Localizes the pain to the outside of hip.  Describes the pain as constant.  Had shot in hip with temporary relief, but not complete resolution of symptoms 3 weeks ago.  Symptoms are made worse with laying on side.  Denies similar symptoms in the past.  Denies fever, chills, erythema, ecchymosis, effusion, weakness, numbness and tingling.   ROS: As per HPI.  All other pertinent ROS negative.     Past Medical History:  Diagnosis Date  . Diabetes mellitus without complication ([place])   . Hep C w/o coma, chronic ([place]) 08/2016   History reviewed. No pertinent surgical history. No Known Allergies No current facility-administered medications on file prior to encounter.   Current Outpatient Medications on File Prior to Encounter  Medication Sig Dispense Refill  . amLODipine (NORVASC) 5 MG tablet Take 5 mg by mouth daily.    Marland Kitchen aspirin EC 81 MG tablet Take 81 mg by mouth daily. Pt takes occasionally    . lisinopril (PRINIVIL,ZESTRIL) 5 MG tablet Take 5 mg by mouth daily.    . naproxen (NAPROSYN) 375 MG tablet Take 1 tablet (375 mg total) by mouth 2 (two) times daily. 20 tablet 0  . simvastatin (ZOCOR) 5 MG tablet Take 5 mg by mouth daily.     Social History   Socioeconomic History  . Marital status: Married    Spouse name: Not on file  . Number of children: Not on file  . Years of education: Not on file  . Highest education level: Not on file  Occupational History  . Not on file  Tobacco Use  . Smoking status: Current Every Day Smoker    Packs/day: 1.00    Types: Cigarettes    Start date: 04/29/1966  . Smokeless tobacco: Never Used  Substance and Sexual Activity  . Alcohol use: Yes    Alcohol/week: 0.0 standard drinks  . Drug use: Not on file  .  Sexual activity: Not on file  Other Topics Concern  . Not on file  Social History Narrative  . Not on file   Social Determinants of Health   Financial Resource Strain:   . Difficulty of Paying Living Expenses: Not on file  Food Insecurity:   . Worried About Charity fundraiser in the Last Year: Not on file  . Ran Out of Food in the Last Year: Not on file  Transportation Needs:   . Lack of Transportation (Medical): Not on file  . Lack of Transportation (Non-Medical): Not on file  Physical Activity:   . Days of Exercise per Week: Not on file  . Minutes of Exercise per Session: Not on file  Stress:   . Feeling of Stress : Not on file  Social Connections:   . Frequency of Communication with Friends and Family: Not on file  . Frequency of Social Gatherings with Friends and Family: Not on file  . Attends Religious Services: Not on file  . Active Member of Clubs or Organizations: Not on file  . Attends Archivist Meetings: Not on file  . Marital Status: Not on file  Intimate Partner Violence:   . Fear of Current or Ex-Partner: Not on file  . Emotionally Abused: Not on file  . Physically  Abused: Not on file  . Sexually Abused: Not on file   Family History  Family history unknown: Yes    OBJECTIVE:  Vitals:   09/12/20 1420  BP: (!) 157/76  Pulse: 68  Resp: 17  Temp: 97.9 F (36.6 C)  TempSrc: Tympanic  SpO2: 97%    General appearance: ALERT; in no acute distress.  Head: NCAT Lungs: Normal respiratory effort Musculoskeletal: RT hip Inspection: Skin warm, dry, clear and intact without obvious erythema, effusion, or ecchymosis.  Palpation: TTP over lateral hip ROM: FROM active and passive Strength: 4+/5 hip flexion, 4+/5 hip extension, 5/5 knee flexion, 5/5 knee extension Skin: warm and dry Neurologic: Ambulates without difficulty Psychological: alert and cooperative; normal mood and affect   ASSESSMENT & PLAN:  1. Hip pain     Meds ordered this  encounter  Medications  . predniSONE (DELTASONE) 20 MG tablet    Sig: Take 1 tablet (20 mg total) by mouth 2 (two) times daily with a meal for 5 days.    Dispense:  10 tablet    Refill:  0    Order Specific Question:   Supervising Provider    Answer:   Raylene Everts [[id number]]  . dexamethasone (DECADRON) injection 10 mg   Steroid shot given in office Continue conservative management of rest, ice, and gentle stretches Prednisone prescribed.  Take as directed and to completion Follow up with PCP for further evaluation and management Return or go to the ER if you have any new or worsening symptoms (fever, chills, chest pain, redness, swelling, bruising, deformity, etc...)   Reviewed expectations re: course of current medical issues. Questions answered. Outlined signs and symptoms indicating need for more acute intervention. Patient verbalized understanding. After Visit Summary given.    Lestine Box, PA-C 09/12/20 1502

## 2020-09-12 NOTE — ED Triage Notes (Signed)
Pt fell a few weeks ago and is still having pain in right hip

## 2020-09-29 DIAGNOSIS — I10 Essential (primary) hypertension: Secondary | ICD-10-CM | POA: Diagnosis not present

## 2020-10-29 DIAGNOSIS — I10 Essential (primary) hypertension: Secondary | ICD-10-CM | POA: Diagnosis not present

## 2020-11-24 DIAGNOSIS — E1122 Type 2 diabetes mellitus with diabetic chronic kidney disease: Secondary | ICD-10-CM | POA: Diagnosis not present

## 2020-11-24 DIAGNOSIS — E1165 Type 2 diabetes mellitus with hyperglycemia: Secondary | ICD-10-CM | POA: Diagnosis not present

## 2020-11-24 DIAGNOSIS — F17210 Nicotine dependence, cigarettes, uncomplicated: Secondary | ICD-10-CM | POA: Diagnosis not present

## 2020-11-24 DIAGNOSIS — Z6828 Body mass index (BMI) 28.0-28.9, adult: Secondary | ICD-10-CM | POA: Diagnosis not present

## 2020-11-24 DIAGNOSIS — N182 Chronic kidney disease, stage 2 (mild): Secondary | ICD-10-CM | POA: Diagnosis not present

## 2020-11-24 DIAGNOSIS — I10 Essential (primary) hypertension: Secondary | ICD-10-CM | POA: Diagnosis not present

## 2020-11-24 DIAGNOSIS — Z299 Encounter for prophylactic measures, unspecified: Secondary | ICD-10-CM | POA: Diagnosis not present

## 2020-11-30 DIAGNOSIS — I10 Essential (primary) hypertension: Secondary | ICD-10-CM | POA: Diagnosis not present

## 2020-12-28 DIAGNOSIS — I10 Essential (primary) hypertension: Secondary | ICD-10-CM | POA: Diagnosis not present

## 2021-01-28 DIAGNOSIS — F17210 Nicotine dependence, cigarettes, uncomplicated: Secondary | ICD-10-CM | POA: Diagnosis not present

## 2021-01-28 DIAGNOSIS — Z1339 Encounter for screening examination for other mental health and behavioral disorders: Secondary | ICD-10-CM | POA: Diagnosis not present

## 2021-01-28 DIAGNOSIS — Z125 Encounter for screening for malignant neoplasm of prostate: Secondary | ICD-10-CM | POA: Diagnosis not present

## 2021-01-28 DIAGNOSIS — R5383 Other fatigue: Secondary | ICD-10-CM | POA: Diagnosis not present

## 2021-01-28 DIAGNOSIS — I10 Essential (primary) hypertension: Secondary | ICD-10-CM | POA: Diagnosis not present

## 2021-01-28 DIAGNOSIS — Z6828 Body mass index (BMI) 28.0-28.9, adult: Secondary | ICD-10-CM | POA: Diagnosis not present

## 2021-01-28 DIAGNOSIS — Z0000 Encounter for general adult medical examination without abnormal findings: Secondary | ICD-10-CM | POA: Diagnosis not present

## 2021-01-28 DIAGNOSIS — Z299 Encounter for prophylactic measures, unspecified: Secondary | ICD-10-CM | POA: Diagnosis not present

## 2021-01-28 DIAGNOSIS — Z79899 Other long term (current) drug therapy: Secondary | ICD-10-CM | POA: Diagnosis not present

## 2021-01-28 DIAGNOSIS — Z7189 Other specified counseling: Secondary | ICD-10-CM | POA: Diagnosis not present

## 2021-01-28 DIAGNOSIS — E7800 Pure hypercholesterolemia, unspecified: Secondary | ICD-10-CM | POA: Diagnosis not present

## 2021-01-28 DIAGNOSIS — Z1331 Encounter for screening for depression: Secondary | ICD-10-CM | POA: Diagnosis not present

## 2021-02-25 DIAGNOSIS — E1122 Type 2 diabetes mellitus with diabetic chronic kidney disease: Secondary | ICD-10-CM | POA: Diagnosis not present

## 2021-02-25 DIAGNOSIS — N182 Chronic kidney disease, stage 2 (mild): Secondary | ICD-10-CM | POA: Diagnosis not present

## 2021-02-25 DIAGNOSIS — R972 Elevated prostate specific antigen [PSA]: Secondary | ICD-10-CM | POA: Diagnosis not present

## 2021-02-25 DIAGNOSIS — Z299 Encounter for prophylactic measures, unspecified: Secondary | ICD-10-CM | POA: Diagnosis not present

## 2021-02-25 DIAGNOSIS — Z6828 Body mass index (BMI) 28.0-28.9, adult: Secondary | ICD-10-CM | POA: Diagnosis not present

## 2021-02-25 DIAGNOSIS — E1165 Type 2 diabetes mellitus with hyperglycemia: Secondary | ICD-10-CM | POA: Diagnosis not present

## 2021-02-25 DIAGNOSIS — F17210 Nicotine dependence, cigarettes, uncomplicated: Secondary | ICD-10-CM | POA: Diagnosis not present

## 2021-02-25 DIAGNOSIS — I10 Essential (primary) hypertension: Secondary | ICD-10-CM | POA: Diagnosis not present

## 2021-02-26 DIAGNOSIS — I10 Essential (primary) hypertension: Secondary | ICD-10-CM | POA: Diagnosis not present

## 2021-03-30 DIAGNOSIS — I10 Essential (primary) hypertension: Secondary | ICD-10-CM | POA: Diagnosis not present

## 2021-04-19 DIAGNOSIS — E119 Type 2 diabetes mellitus without complications: Secondary | ICD-10-CM | POA: Diagnosis not present

## 2021-04-19 DIAGNOSIS — H524 Presbyopia: Secondary | ICD-10-CM | POA: Diagnosis not present

## 2021-04-19 DIAGNOSIS — H2513 Age-related nuclear cataract, bilateral: Secondary | ICD-10-CM | POA: Diagnosis not present

## 2021-04-29 DIAGNOSIS — I10 Essential (primary) hypertension: Secondary | ICD-10-CM | POA: Diagnosis not present

## 2021-05-28 DIAGNOSIS — I10 Essential (primary) hypertension: Secondary | ICD-10-CM | POA: Diagnosis not present

## 2021-06-02 DIAGNOSIS — Z6827 Body mass index (BMI) 27.0-27.9, adult: Secondary | ICD-10-CM | POA: Diagnosis not present

## 2021-06-02 DIAGNOSIS — I10 Essential (primary) hypertension: Secondary | ICD-10-CM | POA: Diagnosis not present

## 2021-06-02 DIAGNOSIS — F17210 Nicotine dependence, cigarettes, uncomplicated: Secondary | ICD-10-CM | POA: Diagnosis not present

## 2021-06-02 DIAGNOSIS — Z299 Encounter for prophylactic measures, unspecified: Secondary | ICD-10-CM | POA: Diagnosis not present

## 2021-06-02 DIAGNOSIS — E1165 Type 2 diabetes mellitus with hyperglycemia: Secondary | ICD-10-CM | POA: Diagnosis not present

## 2021-06-30 DIAGNOSIS — I10 Essential (primary) hypertension: Secondary | ICD-10-CM | POA: Diagnosis not present

## 2021-07-30 DIAGNOSIS — I10 Essential (primary) hypertension: Secondary | ICD-10-CM | POA: Diagnosis not present

## 2021-08-30 DIAGNOSIS — I10 Essential (primary) hypertension: Secondary | ICD-10-CM | POA: Diagnosis not present

## 2021-09-14 DIAGNOSIS — Z299 Encounter for prophylactic measures, unspecified: Secondary | ICD-10-CM | POA: Diagnosis not present

## 2021-09-14 DIAGNOSIS — N400 Enlarged prostate without lower urinary tract symptoms: Secondary | ICD-10-CM | POA: Diagnosis not present

## 2021-09-14 DIAGNOSIS — F17210 Nicotine dependence, cigarettes, uncomplicated: Secondary | ICD-10-CM | POA: Diagnosis not present

## 2021-09-14 DIAGNOSIS — I10 Essential (primary) hypertension: Secondary | ICD-10-CM | POA: Diagnosis not present

## 2021-09-14 DIAGNOSIS — Z23 Encounter for immunization: Secondary | ICD-10-CM | POA: Diagnosis not present

## 2021-09-14 DIAGNOSIS — E1165 Type 2 diabetes mellitus with hyperglycemia: Secondary | ICD-10-CM | POA: Diagnosis not present

## 2021-09-29 DIAGNOSIS — I10 Essential (primary) hypertension: Secondary | ICD-10-CM | POA: Diagnosis not present

## 2021-10-29 DIAGNOSIS — I10 Essential (primary) hypertension: Secondary | ICD-10-CM | POA: Diagnosis not present

## 2021-11-28 DIAGNOSIS — I10 Essential (primary) hypertension: Secondary | ICD-10-CM | POA: Diagnosis not present

## 2021-12-28 DIAGNOSIS — I10 Essential (primary) hypertension: Secondary | ICD-10-CM | POA: Diagnosis not present

## 2022-01-27 DIAGNOSIS — I10 Essential (primary) hypertension: Secondary | ICD-10-CM | POA: Diagnosis not present

## 2022-02-01 DIAGNOSIS — Z6828 Body mass index (BMI) 28.0-28.9, adult: Secondary | ICD-10-CM | POA: Diagnosis not present

## 2022-02-01 DIAGNOSIS — Z125 Encounter for screening for malignant neoplasm of prostate: Secondary | ICD-10-CM | POA: Diagnosis not present

## 2022-02-01 DIAGNOSIS — Z7189 Other specified counseling: Secondary | ICD-10-CM | POA: Diagnosis not present

## 2022-02-01 DIAGNOSIS — Z299 Encounter for prophylactic measures, unspecified: Secondary | ICD-10-CM | POA: Diagnosis not present

## 2022-02-01 DIAGNOSIS — E7800 Pure hypercholesterolemia, unspecified: Secondary | ICD-10-CM | POA: Diagnosis not present

## 2022-02-01 DIAGNOSIS — R5383 Other fatigue: Secondary | ICD-10-CM | POA: Diagnosis not present

## 2022-02-01 DIAGNOSIS — Z79899 Other long term (current) drug therapy: Secondary | ICD-10-CM | POA: Diagnosis not present

## 2022-02-01 DIAGNOSIS — Z1339 Encounter for screening examination for other mental health and behavioral disorders: Secondary | ICD-10-CM | POA: Diagnosis not present

## 2022-02-01 DIAGNOSIS — Z1331 Encounter for screening for depression: Secondary | ICD-10-CM | POA: Diagnosis not present

## 2022-02-01 DIAGNOSIS — Z0000 Encounter for general adult medical examination without abnormal findings: Secondary | ICD-10-CM | POA: Diagnosis not present

## 2022-02-15 DIAGNOSIS — I10 Essential (primary) hypertension: Secondary | ICD-10-CM | POA: Diagnosis not present

## 2022-02-15 DIAGNOSIS — R972 Elevated prostate specific antigen [PSA]: Secondary | ICD-10-CM | POA: Diagnosis not present

## 2022-02-15 DIAGNOSIS — F17210 Nicotine dependence, cigarettes, uncomplicated: Secondary | ICD-10-CM | POA: Diagnosis not present

## 2022-02-15 DIAGNOSIS — E1122 Type 2 diabetes mellitus with diabetic chronic kidney disease: Secondary | ICD-10-CM | POA: Diagnosis not present

## 2022-02-15 DIAGNOSIS — E1165 Type 2 diabetes mellitus with hyperglycemia: Secondary | ICD-10-CM | POA: Diagnosis not present

## 2022-02-15 DIAGNOSIS — Z299 Encounter for prophylactic measures, unspecified: Secondary | ICD-10-CM | POA: Diagnosis not present

## 2022-02-23 IMAGING — DX DG WRIST COMPLETE 3+V*L*
4 series · 4 of 4 positions shown · non-contrast
Comparison: None.

CLINICAL DATA: Status post fall.

EXAM:
LEFT WRIST - COMPLETE 3+ VIEW

[wrist pa (1 of 2)]
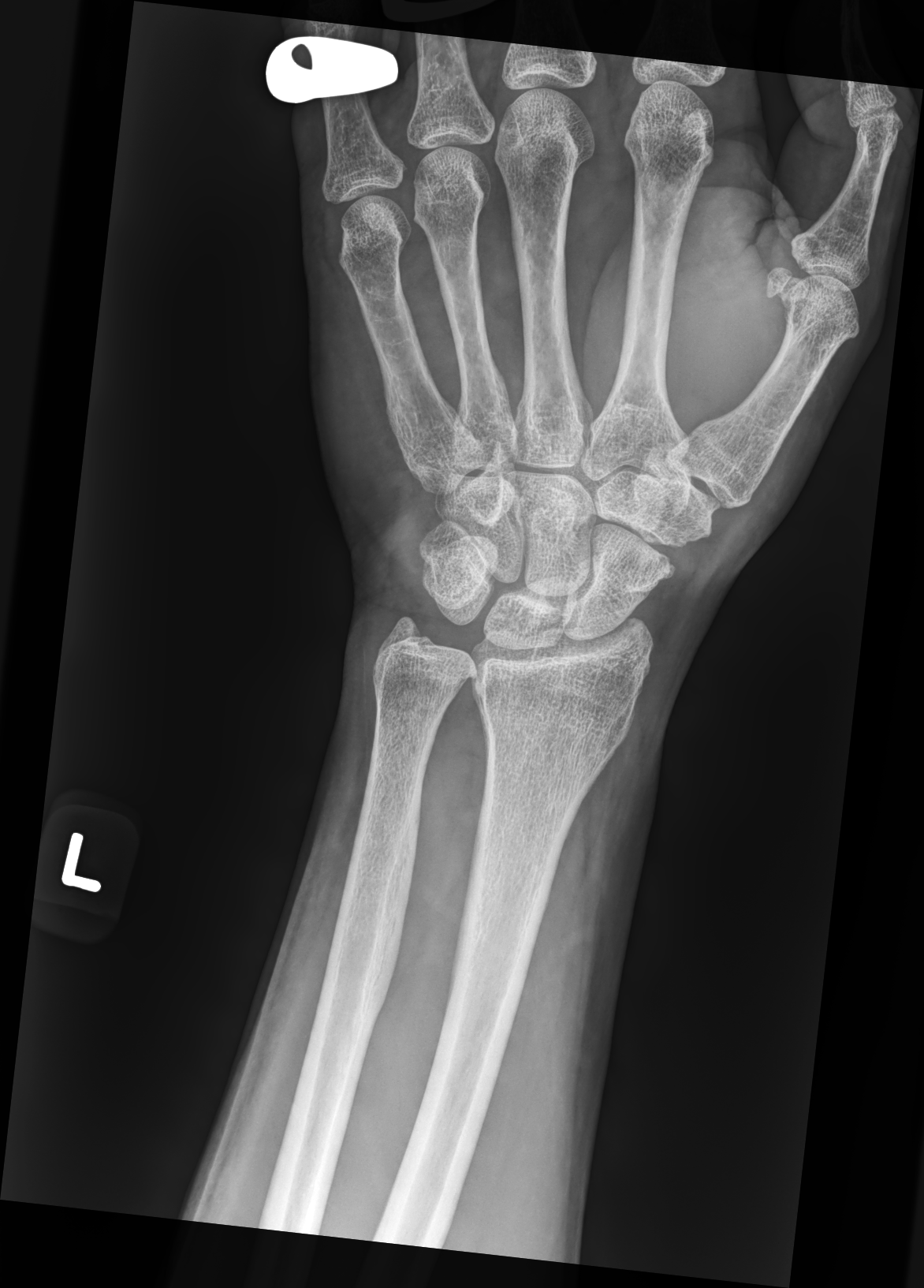

[wrist mlo]
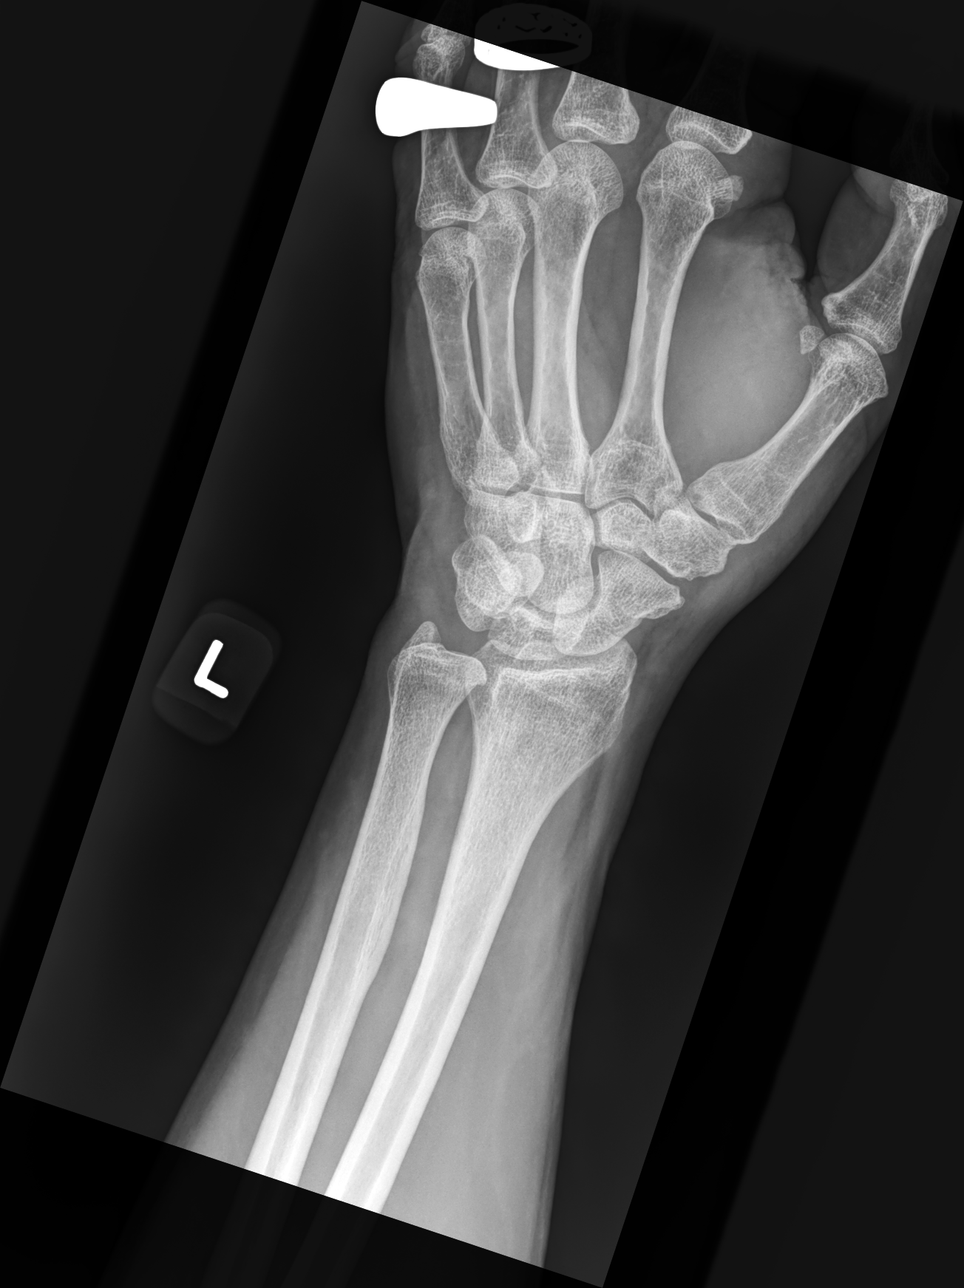

[wrist lat]
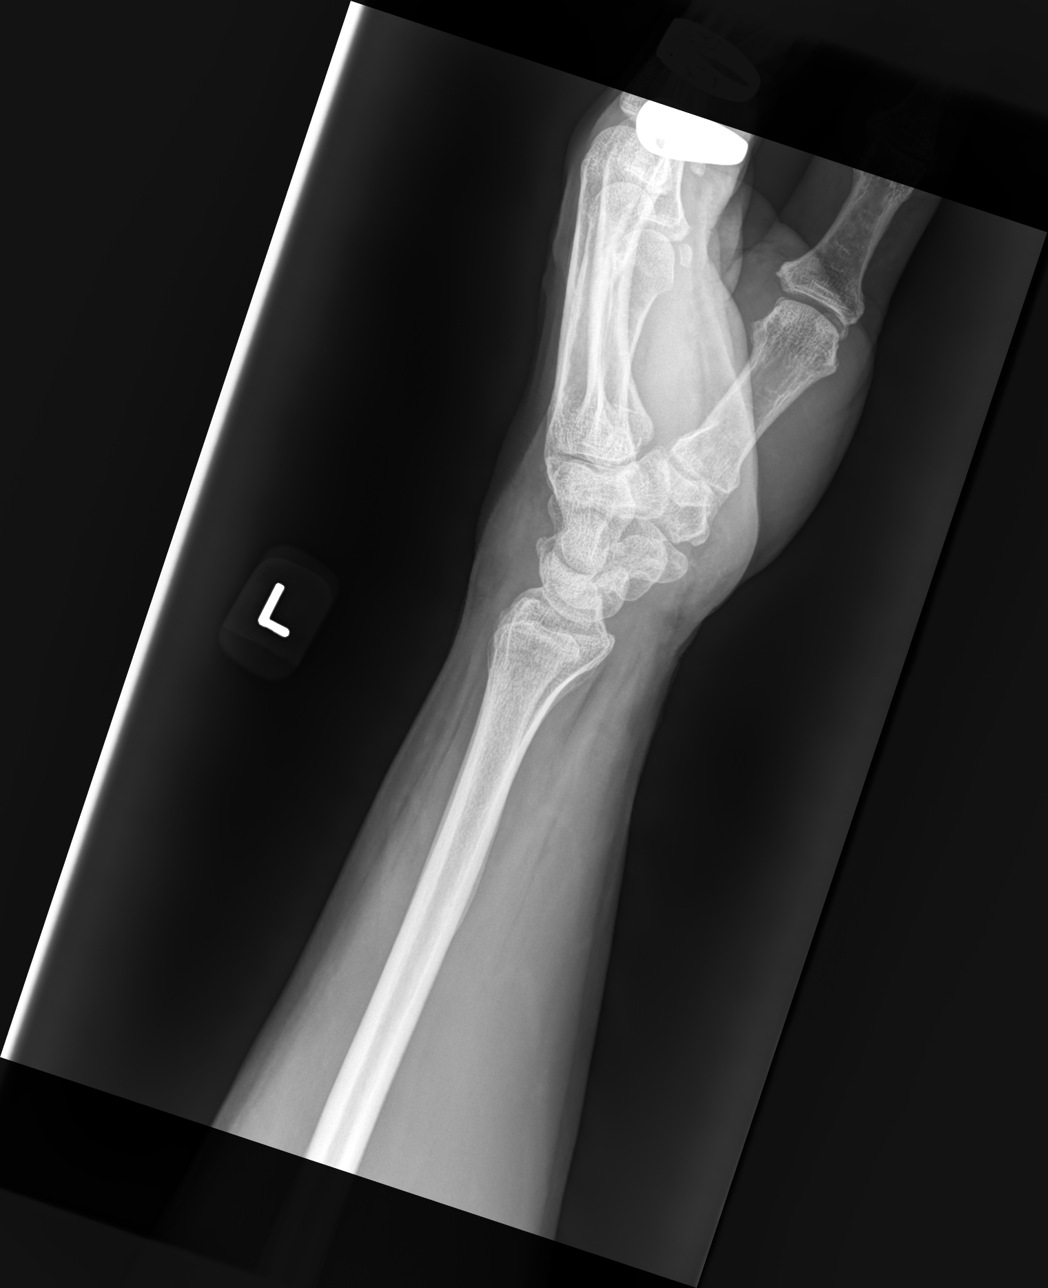

[wrist pa (2 of 2)]
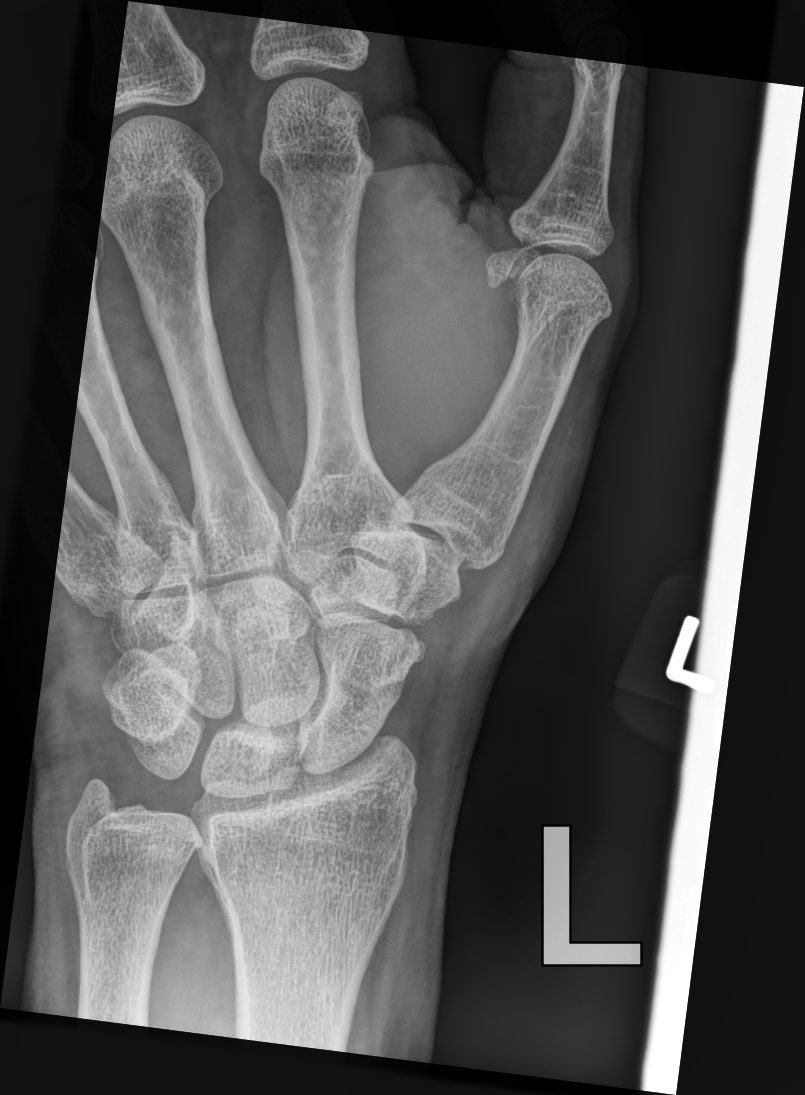

[4 of 4 positions shown; findings below may reference images not displayed]

FINDINGS: There is no evidence of fracture or dislocation. There is no
evidence of significant arthropathy or other focal bone abnormality.
Mild soft tissue swelling is seen along the dorsal aspect of the
left wrist.
IMPRESSION: Mild dorsal soft tissue swelling without evidence of an acute
osseous abnormality.

## 2022-02-27 DIAGNOSIS — I10 Essential (primary) hypertension: Secondary | ICD-10-CM | POA: Diagnosis not present

## 2022-03-23 ENCOUNTER — Encounter: Payer: Self-pay | Admitting: Urology

## 2022-03-23 ENCOUNTER — Ambulatory Visit: Payer: PPO | Admitting: Urology

## 2022-03-23 VITALS — BP 146/67 | HR 67

## 2022-03-23 DIAGNOSIS — R972 Elevated prostate specific antigen [PSA]: Secondary | ICD-10-CM

## 2022-03-23 MED ORDER — LEVOFLOXACIN 750 MG PO TABS: 750.0000 mg | ORAL_TABLET | Freq: Once | ORAL | 0 refills | Status: AC

## 2022-03-23 NOTE — Progress Notes (Signed)
03/23/2022 9:07 AM   Spencer Mills July 28, 1951 [id number]  Referring provider: Monico Blitz, MD [place],  [place] [address]  Elevated PSA  HPI: Spencer Mills is a 71yo here for evalaution of elevated PSA. His last PSA 1 month ago was 12. He previously saw me in 2020 for elevated PSA and at that time the PSA was 8.6. IPSS 1 QOL 0. Nocturia 1x. No hematuria or dysuria. No family hx of prostate cancer. His records from [place] are as follows:  My PSA is elevated above the normal range. HPI: Spencer Mills is a 71 year-old male patient who was referred by Dr. Weldon Picking C. Manuella Ghazi, MD who is here for an elevated PSA.??His PSA is 8.6. He has not had PSA's drawn prior to this one. He indicates there were no urinary problems when the PSA was drawn. He has not had a prostate nodule on a physical examination. He has not had recurrent prostate infections or chronic prostatitis. ??He has not been on antibiotics for prostate infections previously. He has not had a prostate biopsy done. ??06/12/2019: His PSA is 8.6 in 04/2019. patient does not recall previous PSA values. No significant LUTS. no hematuria or dysuria. ???AUA Symptom Score: He never has the sensation of not emptying his bladder completely after finishing urinating. He never has to urinate again less that two hours after he has finished urinating. He does not have to stop and start again several times when he urinates. Less than 20% of the time he finds it difficult to postpone urination. He never has a weak urinary stream. He never has to push or strain to begin urination. He has to get up to urinate 1 time from the time he goes to bed until the time he gets up in the morning. ??Calculated AUA Symptom Score: 2??QOL Score: He would feel pleased if he had to live with his urinary condition the way it is now for the rest of his life. ??Calculated QOL Symptom Score: 1?  PMH: Past Medical History:  Diagnosis Date   Diabetes mellitus without complication ([place])     Hep C w/o coma, chronic ([place]) 08/2016    Surgical History: No past surgical history on file.  Home Medications:  Allergies as of 03/23/2022   No Known Allergies      Medication List        Accurate as of Mar 23, 2022  9:07 AM. If you have any questions, ask your nurse or doctor.          amLODipine 5 MG tablet Commonly known as: NORVASC Take 5 mg by mouth daily.   aspirin EC 81 MG tablet Take 81 mg by mouth daily. Pt takes occasionally   lisinopril 5 MG tablet Commonly known as: ZESTRIL Take 5 mg by mouth daily.   lisinopril 40 MG tablet Commonly known as: ZESTRIL Take 40 mg by mouth daily.   naproxen 375 MG tablet Commonly known as: NAPROSYN Take 1 tablet (375 mg total) by mouth 2 (two) times daily.   simvastatin 5 MG tablet Commonly known as: ZOCOR Take 5 mg by mouth daily.   tadalafil 20 MG tablet Commonly known as: CIALIS Take 20 mg by mouth daily.        Allergies: No Known Allergies  Family History: Family History  Family history unknown: Yes    Social History:  reports that he has been smoking cigarettes. He started smoking about 55 years ago. He has been smoking an average of 1  pack per day. He has never used smokeless tobacco. He reports current alcohol use. No history on file for drug use.  ROS: All other review of systems were reviewed and are negative except what is noted above in HPI  Physical Exam: BP (!) 146/67   Pulse 67   Constitutional:  Alert and oriented, No acute distress. HEENT: [place] AT, moist mucus membranes.  Trachea midline, no masses. Cardiovascular: No clubbing, cyanosis, or edema. Respiratory: Normal respiratory effort, no increased work of breathing. GI: Abdomen is soft, nontender, nondistended, no abdominal masses GU: No CVA tenderness. Circumcised phallus. No masses/lesions on penis, testis, scrotum. Prostate 40g smooth firm. Lymph: No cervical or inguinal lymphadenopathy. Skin: No rashes, bruises or suspicious  lesions. Neurologic: Grossly intact, no focal deficits, moving all 4 extremities. Psychiatric: Normal mood and affect.  Laboratory Data: Lab Results  Component Value Date   WBC 8.8 12/08/2016   HGB 15.6 12/08/2016   HCT 45.9 12/08/2016   MCV 92.4 12/08/2016   PLT 217 12/08/2016    No results found for: CREATININE  No results found for: PSA  No results found for: TESTOSTERONE  No results found for: HGBA1C  Urinalysis No results found for: COLORURINE, APPEARANCEUR, LABSPEC, PHURINE, GLUCOSEU, HGBUR, BILIRUBINUR, KETONESUR, PROTEINUR, UROBILINOGEN, NITRITE, LEUKOCYTESUR  No results found for: LABMICR, [place], RBCUA, LABEPIT, MUCUS, BACTERIA  Pertinent Imaging:  No results found for this or any previous visit.  No results found for this or any previous visit.  No results found for this or any previous visit.  No results found for this or any previous visit.  No results found for this or any previous visit.  No results found for this or any previous visit.  No results found for this or any previous visit.  No results found for this or any previous visit.   Assessment & Plan:    1. Elevated PSA The patient and I talked about etiologies of elevated PSA.  We discussed the possible relationship between elevated PSA, prostate cancer, BPH, prostatitis, and UTI.   Conservative treatment of elevated PSA with watchful waiting was discussed with the patient.  All questions were answered.        All of the risks and benefits along with alternatives to prostate biopsy were discussed with the patient.  The patient gave fully informed consent to proceed with a transrectal ultrasound guided biopsy of the prostate for the evaluation of their evated PSA.  Prostate biopsy instructions and antibiotics were given to the patient.  - Urinalysis, Routine w reflex microscopic   No follow-ups on file.  Nicolette Bang, MD  [facility] [place]

## 2022-03-23 NOTE — Patient Instructions (Addendum)
Appointment Time:03/30/2022 Appointment Date:12:15p  Location: [facility] Department   Prostate Biopsy Instructions  Stop all aspirin or blood thinners (aspirin, plavix, coumadin, warfarin, motrin, ibuprofen, advil, aleve, naproxen, naprosyn) for 7 days prior to the procedure.  If you have any questions about stopping these medications, please contact your primary care physician or cardiologist.  Having a light meal prior to the procedure is recommended.  If you are diabetic or have low blood sugar please bring a small snack or glucose tablet.  A Fleets enema is needed to be purchased over the counter at a local pharmacy and used 2 hours before you scheduled appointment.  This can be purchased over the counter at any pharmacy.  Antibiotics will be administered in the clinic at the time of the procedure and 1 tablet has been sent to your pharmacy. Please take the antibiotic as prescribed.    Please bring someone with you to the procedure to drive you home if you are given a valium to take prior to your procedure.   If you have any questions or concerns, please feel free to call the office at [phone] or send a Mychart message.    Thank you, [facility]   Transrectal Ultrasound-Guided Prostate Biopsy A transrectal ultrasound-guided prostate biopsy is a procedure to remove samples of prostate tissue for testing. The prostate is a walnut-sized gland that is located below the bladder and in front of the rectum. During this procedure, a small device (probe) is lubricated and put inside the rectum. The probe sends out sound waves that make a picture of the prostate and surrounding tissues (transrectal ultrasound). The images are used to help guide the process of removing the samples. The samples are taken to a lab to be checked for prostate cancer. This procedure is usually done to evaluate the prostate gland of men who have raised (elevated) levels of prostate-specific  antigen (PSA), which can be a sign of prostate cancer or prostate enlargement related to aging (benign prostatic hyperplasia, or BPH). Tell a health care provider about: Any allergies you have. All medicines you are taking, including vitamins, herbs, eye drops, creams, and over-the-counter medicines. Any problems you or family members have had with anesthetic medicines. Any bleeding problems you have. Any surgeries you have had. Any medical conditions you have. Any prostate infections you have had. What are the risks? Generally, this is a safe procedure. However, problems may occur, including: Prostate infection. Bleeding from the rectum. Blood in the urine. Allergic reactions to medicines. Damage to surrounding structures such as blood vessels, organs, or muscles. Difficulty passing urine. Nerve damage. This is usually temporary. What happens before the procedure? Medicines Ask your health care provider about: Changing or stopping your regular medicines. This is especially important if you are taking diabetes medicines or blood thinners. Taking medicines such as aspirin and ibuprofen. These medicines can thin your blood. Do not take these medicines unless your health care provider tells you to take them. Taking over-the-counter medicines, vitamins, herbs, and supplements. General instructions Follow instructions from your health care provider about eating and drinking. In most instances, you will not need to stop eating and drinking completely before the procedure. You will be given an enema. During an enema, a liquid is injected into your rectum to clear out waste. You may have a blood or urine sample taken. Ask your health care provider what steps will be taken to help prevent infection. These steps may include: Washing skin with a  germ-killing soap. Taking antibiotic medicine. If you will be going home right after the procedure, plan to have a responsible adult: Take you home from  the hospital or clinic. You will not be allowed to drive. Care for you for the time you are told. What happens during the procedure?  An IV will be inserted into one of your veins. You will be given one or both of the following: A medicine to help you relax (sedative). A medicine to numb the area (local anesthetic). You will be placed on your left side, and your knees will be bent toward your chest. A probe with lubricated gel will be placed into your rectum, and images will be taken of your prostate and surrounding structures. Numbing medicine will be injected into your prostate. A biopsy needle will be inserted through your rectum or perineum and guided to your prostate using the ultrasound images. Prostate tissue samples will be removed, and the needle and probe will then be removed. The biopsy samples will be sent to a lab to be tested. The procedure may vary among health care providers and hospitals. What happens after the procedure? Your blood pressure, heart rate, breathing rate, and blood oxygen level will be monitored until you leave the hospital or clinic. You may have some discomfort in the rectal area. You will be given pain medicine as needed. If you were given a sedative during the procedure, it can affect you for several hours. Do not drive or operate machinery until your health care provider says that it is safe. It is up to you to get the results of your procedure. Ask your health care provider, or the department that is doing the procedure, when your results will be ready. Keep all follow-up visits. This is important. Summary A transrectal ultrasound-guided biopsy removes samples of tissue from your prostate using ultrasound-guided sound waves to help guide the process. This procedure is usually done to evaluate the prostate gland of men who have raised (elevated) levels of prostate-specific antigen (PSA), which can be a sign of prostate cancer or prostate enlargement related  to aging. After your procedure, you may feel some discomfort in the rectal area. Plan to have a responsible adult take you home from the hospital or clinic, and follow up with your health care provider for your results. This information is not intended to replace advice given to you by your health care provider. Make sure you discuss any questions you have with your health care provider. Document Revised: 04/12/2021 Document Reviewed: 04/12/2021 Elsevier Patient Education  [place].

## 2022-03-24 LAB — URINALYSIS, ROUTINE W REFLEX MICROSCOPIC
Bilirubin, UA: NEGATIVE
Glucose, UA: NEGATIVE
Ketones, UA: NEGATIVE
Leukocytes,UA: NEGATIVE
Nitrite, UA: NEGATIVE
Specific Gravity, UA: >1.03 — ABNORMAL HIGH (ref 1.005–1.030)
Urobilinogen, Ur: 0.2 mg/dL (ref 0.2–1.0)
pH, UA: 6 (ref 5.0–7.5)

## 2022-03-24 LAB — MICROSCOPIC EXAMINATION
Bacteria, UA: NONE SEEN
Renal Epithel, UA: NONE SEEN /hpf
WBC, UA: NONE SEEN /hpf (ref 0–5)

## 2022-03-29 DIAGNOSIS — I10 Essential (primary) hypertension: Secondary | ICD-10-CM | POA: Diagnosis not present

## 2022-03-30 ENCOUNTER — Other Ambulatory Visit: Payer: PPO | Admitting: Urology

## 2022-04-20 ENCOUNTER — Ambulatory Visit (HOSPITAL_BASED_OUTPATIENT_CLINIC_OR_DEPARTMENT_OTHER): Payer: PPO | Admitting: Urology

## 2022-04-20 ENCOUNTER — Ambulatory Visit (HOSPITAL_COMMUNITY)
Admission: RE | Admit: 2022-04-20 | Discharge: 2022-04-20 | Disposition: A | Discharge status: Home / Self Care | Payer: PPO | Source: Ambulatory Visit | Attending: Urology | Admitting: Urology

## 2022-04-20 ENCOUNTER — Encounter: Payer: Self-pay | Admitting: Urology

## 2022-04-20 ENCOUNTER — Other Ambulatory Visit: Payer: Self-pay | Admitting: Urology

## 2022-04-20 DIAGNOSIS — R972 Elevated prostate specific antigen [PSA]: Secondary | ICD-10-CM

## 2022-04-20 DIAGNOSIS — C61 Malignant neoplasm of prostate: Secondary | ICD-10-CM | POA: Diagnosis not present

## 2022-04-20 MED ORDER — LIDOCAINE HCL (PF) 2 % IJ SOLN
INTRAMUSCULAR | Status: AC
  Administered 2022-04-20: 10 mL
  Filled 2022-04-20: qty 10

## 2022-04-20 MED ORDER — CEFTRIAXONE SODIUM 1 G IJ SOLR
INTRAMUSCULAR | Status: AC
  Administered 2022-04-20: 1 g
  Filled 2022-04-20: qty 10

## 2022-04-20 NOTE — Progress Notes (Signed)
Patient brought to room ambulatory. Prepped and disrobed in manner for prostate biopsy. Placed side lying. Rocephin IM admin without adverse effect. Lidocaine administered without complications. Collection device introduced and samples obtained. Placed clothing back on. Able to dress self, able to stand with no difficulty.

## 2022-04-20 NOTE — Patient Instructions (Signed)
Transrectal Ultrasound-Guided Prostate Biopsy, Care After What can I expect after the procedure? After the procedure, it is common to have: Pain and discomfort near your butt (rectum), especially while sitting. Pink-colored pee (urine). This is due to small amounts of blood in your pee. A burning feeling while peeing. Blood in your poop (stool). Bleeding from your butt. Blood in your semen. Follow these instructions at home: Medicines Take over-the-counter and prescription medicines only as told by your doctor. If you were given a sedative during your procedure, do not drive or use machines until your doctor says that it is safe. A sedative is a medicine that helps you relax. If you were prescribed an antibiotic medicine, take it as told by your doctor. Do not stop taking it even if you start to feel better. Activity  Return to your normal activities when your doctor says that it is safe. Ask your doctor when it is okay for you to have sex. You may have to avoid lifting. Ask your doctor how much you can safely lift. General instructions  Drink enough water to keep your pee pale yellow. Watch your pee, poop, and semen for new bleeding or bleeding that gets worse. Keep all follow-up visits. Contact a doctor if: You have any of these: Blood clots in your pee or poop. Blood in your pee more than 2 weeks after the procedure. Blood in your semen more than 2 months after the procedure. New or worse bleeding in your pee, poop, or semen. Very bad belly pain. Your pee smells bad or unusual. You have trouble peeing. Your lower belly feels firm. You have problems getting an erection. You feel like you may vomit (are nauseous), or you vomit. Get help right away if: You have a fever or chills. You have bright red pee. You have very bad pain that does not get better with medicine. You cannot pee. Summary After this procedure, it is common to have pain and discomfort near your butt,  especially while sitting. You may have blood in your pee and poop. It is common to have blood in your semen. Get help right away if you have a fever or chills. This information is not intended to replace advice given to you by your health care provider. Make sure you discuss any questions you have with your health care provider. Document Revised: 04/12/2021 Document Reviewed: 04/12/2021 Elsevier Patient Education  2023 Elsevier Inc.  

## 2022-04-20 NOTE — Progress Notes (Signed)
Prostate Biopsy Procedure   Informed consent was obtained after discussing risks/benefits of the procedure.  A time out was performed to ensure correct patient identity.  Pre-Procedure: - Last PSA Level: No results found for: "PSA" - Gentamicin given prophylactically - Levaquin 500 mg administered PO -Transrectal Ultrasound performed revealing a 28.9 gm prostate -No significant hypoechoic or median lobe noted  Procedure: - Prostate block performed using 10 cc 1% lidocaine and biopsies taken from sextant areas, a total of 12 under ultrasound guidance.  Post-Procedure: - Patient tolerated the procedure well - He was counseled to seek immediate medical attention if experiences any severe pain, significant bleeding, or fevers - Return in one week to discuss biopsy results

## 2022-04-26 ENCOUNTER — Telehealth: Payer: Self-pay

## 2022-04-26 NOTE — Telephone Encounter (Signed)
Returned called to patient. Left message to return call to office in am

## 2022-04-27 ENCOUNTER — Encounter: Payer: Self-pay | Admitting: Urology

## 2022-04-27 ENCOUNTER — Ambulatory Visit (INDEPENDENT_AMBULATORY_CARE_PROVIDER_SITE_OTHER): Payer: PPO | Admitting: Urology

## 2022-04-27 VITALS — BP 131/75 | HR 60

## 2022-04-27 DIAGNOSIS — C61 Malignant neoplasm of prostate: Secondary | ICD-10-CM | POA: Diagnosis not present

## 2022-04-27 NOTE — Patient Instructions (Signed)

## 2022-04-27 NOTE — Progress Notes (Signed)
04/27/2022 4:15 PM   [name] 13-Nov-1950 [id number]  Referring provider: Monico Blitz, MD [place],  [place] [address]  Followup prostate biopsy   HPI: Mr Galicia is a 71yo here for followup after prostate biopsy. Biopsy revealed Gleason 4+5=9 in 2/12 cores and Gleason 3+4=7 in 9/12 cores. PSA 12. He has mild LUTS. He has moderate erectile dysfunction.    PMH: Past Medical History:  Diagnosis Date   Diabetes mellitus without complication ([place])    Hep C w/o coma, chronic ([place]) 08/2016    Surgical History: No past surgical history on file.  Home Medications:  Allergies as of 04/27/2022   No Known Allergies      Medication List        Accurate as of April 27, 2022  4:15 PM. If you have any questions, ask your nurse or doctor.          amLODipine 5 MG tablet Commonly known as: NORVASC Take 5 mg by mouth daily.   aspirin EC 81 MG tablet Take 81 mg by mouth daily. Pt takes occasionally   lisinopril 5 MG tablet Commonly known as: ZESTRIL Take 5 mg by mouth daily.   lisinopril 40 MG tablet Commonly known as: ZESTRIL Take 40 mg by mouth daily.   naproxen 375 MG tablet Commonly known as: NAPROSYN Take 1 tablet (375 mg total) by mouth 2 (two) times daily.   simvastatin 5 MG tablet Commonly known as: ZOCOR Take 5 mg by mouth daily.   tadalafil 20 MG tablet Commonly known as: CIALIS Take 20 mg by mouth daily.        Allergies: No Known Allergies  Family History: Family History  Family history unknown: Yes    Social History:  reports that he has been smoking cigarettes. He started smoking about 56 years ago. He has been smoking an average of 1 pack per day. He has never used smokeless tobacco. He reports current alcohol use. No history on file for drug use.  ROS: All other review of systems were reviewed and are negative except what is noted above in HPI  Physical Exam: BP 131/75   Pulse 60   Constitutional:  Alert and oriented, No  acute distress. HEENT: [place] AT, moist mucus membranes.  Trachea midline, no masses. Cardiovascular: No clubbing, cyanosis, or edema. Respiratory: Normal respiratory effort, no increased work of breathing. GI: Abdomen is soft, nontender, nondistended, no abdominal masses GU: No CVA tenderness.  Lymph: No cervical or inguinal lymphadenopathy. Skin: No rashes, bruises or suspicious lesions. Neurologic: Grossly intact, no focal deficits, moving all 4 extremities. Psychiatric: Normal mood and affect.  Laboratory Data: Lab Results  Component Value Date   WBC 8.8 12/08/2016   HGB 15.6 12/08/2016   HCT 45.9 12/08/2016   MCV 92.4 12/08/2016   PLT 217 12/08/2016    No results found for: "CREATININE"  No results found for: "PSA"  No results found for: "TESTOSTERONE"  No results found for: "HGBA1C"  Urinalysis    Component Value Date/Time   APPEARANCEUR Clear 03/23/2022 1022   GLUCOSEU Negative 03/23/2022 1022   BILIRUBINUR Negative 03/23/2022 1022   PROTEINUR 3+ (A) 03/23/2022 1022   NITRITE Negative 03/23/2022 1022   LEUKOCYTESUR Negative 03/23/2022 1022    Lab Results  Component Value Date   LABMICR See below: 03/23/2022   WBCUA None seen 03/23/2022   LABEPIT 0-10 03/23/2022   BACTERIA None seen 03/23/2022    Pertinent Imaging:  No results found for this  or any previous visit.  No results found for this or any previous visit.  No results found for this or any previous visit.  No results found for this or any previous visit.  No results found for this or any previous visit.  No results found for this or any previous visit.  No results found for this or any previous visit.  No results found for this or any previous visit.   Assessment & Plan:    1. Prostate cancer [facility]) I discussed the natural history of high risk prostate cancer with the patient and the various treatment options including active surveillance, RALP, IMRT, brachytherapy, cryotherapy, HIFU and  ADT. After discussing the options the patient elects for radiation therapy. I refer him to Dr. Tammi Klippel for consideration of radiation therapy. I will obtain a CT and bone scan prior to his appointment with Dr. Tammi Klippel.    No follow-ups on file.  Nicolette Bang, MD  [facility] [place]

## 2022-04-28 LAB — BASIC METABOLIC PANEL
BUN/Creatinine Ratio: 11 (ref 10–24)
BUN: 13 mg/dL (ref 8–27)
CO2: 22 mmol/L (ref 20–29)
Calcium: 9.3 mg/dL (ref 8.6–10.2)
Chloride: 105 mmol/L (ref 96–106)
Creatinine, Ser: 1.21 mg/dL (ref 0.76–1.27)
Glucose: 88 mg/dL (ref 70–99)
Potassium: 4.5 mmol/L (ref 3.5–5.2)
Sodium: 144 mmol/L (ref 134–144)
eGFR: 64 mL/min/{1.73_m2} (ref >59)

## 2022-04-28 NOTE — Telephone Encounter (Signed)
Patient seen in office 04/27/22

## 2022-04-29 ENCOUNTER — Telehealth: Payer: Self-pay | Admitting: Radiation Oncology

## 2022-04-29 NOTE — Telephone Encounter (Signed)
Called patient to schedule a consultation w. Dr. Manning. No answer, LVM for a return call.  ?

## 2022-05-02 ENCOUNTER — Telehealth: Payer: Self-pay | Admitting: Radiation Oncology

## 2022-05-02 NOTE — Telephone Encounter (Signed)
Sent unable to contact letter 7/3.

## 2022-05-02 NOTE — Telephone Encounter (Signed)
Called patient to schedule a consultation w. Dr. Manning. No answer, LVM for a return call.  ?

## 2022-05-04 ENCOUNTER — Telehealth: Payer: Self-pay

## 2022-05-04 NOTE — Telephone Encounter (Signed)
Patient called inquiring on pain medication that was suppose to be sent in for his back pain since prostate biopsy.

## 2022-05-10 NOTE — Telephone Encounter (Signed)
Patient called with no answer. Message left to return call to office. 

## 2022-05-13 ENCOUNTER — Telehealth: Payer: Self-pay

## 2022-05-13 NOTE — Telephone Encounter (Signed)
Ariel from the Radiation Dept called advising they have been unable to reach patient. They have called and mailed a letter. I attempted to contact patient as well and left a vm advising to reach out to the radiation dept to schedule visit. They advised they would give it alittle more time and then close out the referral. We could send another when the patient reached out and was ready to schedule visits. Just wanted to make you aware.     Thank you

## 2022-05-16 ENCOUNTER — Ambulatory Visit (HOSPITAL_COMMUNITY)
Admission: RE | Admit: 2022-05-16 | Discharge: 2022-05-16 | Disposition: A | Discharge status: Home / Self Care | Payer: PPO | Source: Ambulatory Visit | Attending: Urology | Admitting: Urology

## 2022-05-16 ENCOUNTER — Encounter (HOSPITAL_COMMUNITY)
Admission: RE | Admit: 2022-05-16 | Discharge: 2022-05-16 | Disposition: A | Discharge status: Home / Self Care | Payer: PPO | Source: Ambulatory Visit | Attending: Urology | Admitting: Urology

## 2022-05-16 DIAGNOSIS — C61 Malignant neoplasm of prostate: Secondary | ICD-10-CM | POA: Diagnosis not present

## 2022-05-16 MED ORDER — TECHNETIUM TC 99M MEDRONATE IV KIT
20.0000 | PACK | Freq: Once | INTRAVENOUS | Status: AC | PRN
  Administered 2022-05-16: 20 via INTRAVENOUS

## 2022-05-24 ENCOUNTER — Ambulatory Visit (HOSPITAL_COMMUNITY)
Admission: RE | Admit: 2022-05-24 | Discharge: 2022-05-24 | Disposition: A | Discharge status: Home / Self Care | Payer: PPO | Source: Ambulatory Visit | Attending: Urology | Admitting: Urology

## 2022-05-24 DIAGNOSIS — E1165 Type 2 diabetes mellitus with hyperglycemia: Secondary | ICD-10-CM | POA: Diagnosis not present

## 2022-05-24 DIAGNOSIS — N281 Cyst of kidney, acquired: Secondary | ICD-10-CM | POA: Diagnosis not present

## 2022-05-24 DIAGNOSIS — M549 Dorsalgia, unspecified: Secondary | ICD-10-CM | POA: Diagnosis not present

## 2022-05-24 DIAGNOSIS — Z299 Encounter for prophylactic measures, unspecified: Secondary | ICD-10-CM | POA: Diagnosis not present

## 2022-05-24 DIAGNOSIS — C61 Malignant neoplasm of prostate: Secondary | ICD-10-CM | POA: Insufficient documentation

## 2022-05-24 DIAGNOSIS — I700 Atherosclerosis of aorta: Secondary | ICD-10-CM | POA: Diagnosis not present

## 2022-05-24 DIAGNOSIS — K5730 Diverticulosis of large intestine without perforation or abscess without bleeding: Secondary | ICD-10-CM | POA: Diagnosis not present

## 2022-05-24 DIAGNOSIS — I10 Essential (primary) hypertension: Secondary | ICD-10-CM | POA: Diagnosis not present

## 2022-05-24 DIAGNOSIS — E1122 Type 2 diabetes mellitus with diabetic chronic kidney disease: Secondary | ICD-10-CM | POA: Diagnosis not present

## 2022-05-24 MED ORDER — IOHEXOL 300 MG/ML  SOLN
100.0000 mL | Freq: Once | INTRAMUSCULAR | Status: AC | PRN
  Administered 2022-05-24: 100 mL via INTRAVENOUS

## 2022-05-24 NOTE — Progress Notes (Signed)
GU Location of Tumor / Histology: Prostate Ca  If Prostate Cancer, Gleason Score is (4 + 5) and PSA is (12) as of 04/19/2022  Biopsy revealed Gleason 4+5=9 in 2/12 cores and Gleason 3+4=7 in 9/12 cores. PSA 12. He has mild LUTS. He has moderate erectile dysfunction.     04/27/2022 Dr. Alyson Ingles NM Bone Scan Whole Body CLINICAL DATA:  Prostate cancer  FINDINGS: Bilateral renal function excretion. Increased activity noted over both shoulders and both sternoclavicular joints, both hips, both knees, and both ankles. These findings are most likely degenerative.  Multiple areas of increased activity noted throughout the cervical, thoracic, and lumbar spine. Although these changes may be degenerative, metastatic disease cannot be excluded. Further evaluation with plain films of the cervical, thoracic, lumbar spine should be considered.   IMPRESSION: 1.  Diffuse degenerative change. 2. Multiple areas of increased activity noted throughout the cervical, thoracic, and lumbar spine. Although these changes may be degenerative, metastatic disease cannot be excluded. Further evaluation with plain films of the cervical, thoracic, lumbar spine should be considered.   Past/Anticipated interventions by urology, if any: NA  Past/Anticipated interventions by medical oncology, if any: NA  Weight changes, if any: No  IPPS:  3 SHIM:  5  Bowel/Bladder complaints, if any:  No  Nausea/Vomiting, if any: No  Pain issues, if any:  0/10  SAFETY ISSUES: Prior radiation? No Pacemaker/ICD? No Possible current pregnancy? Male Is the patient on methotrexate? No  Current Complaints / other details:

## 2022-05-27 DIAGNOSIS — C61 Malignant neoplasm of prostate: Secondary | ICD-10-CM | POA: Insufficient documentation

## 2022-05-27 NOTE — Progress Notes (Signed)
Radiation Oncology         [phone] ________________________________  Initial Outpatient Consultation  Name: Spencer Mills MRN: [id number]  Date: 05/30/2022  DOB: 02-11-1951  QV:ZDGL, Weldon Picking, MD  Alyson Ingles Candee Furbish, MD   REFERRING PHYSICIAN: Cleon Gustin, MD  DIAGNOSIS: 72 y.o. gentleman with Stage T1c adenocarcinoma of the prostate with Gleason score of 4+5, and PSA of 12.    ICD-10-CM   1. Malignant neoplasm of prostate ([place])  C61       HISTORY OF PRESENT ILLNESS: Spencer Mills is a 71 y.o. male with a diagnosis of prostate cancer. He has a history of elevated PSA since at least 2020, previously seen by Dr. Alyson Ingles in August 2020 with a PSA at 8.6 in June 2020.  He denied any LUTS and DRE at that time was normal so the patient elected to continue with PSA surveillance.  More recently, he was noted to have further elevated PSA of 12 by his primary care physician, Dr. Manuella Ghazi.  Accordingly, he was referred back to urology for further evaluation with Dr. Alyson Ingles on 03/23/2022,  digital rectal examination was performed at that time revealing a smooth, firm gland without any discrete nodularity noted.  The patient proceeded to transrectal ultrasound with 12 biopsies of the prostate on 04/20/2022.  The prostate volume measured 28.9 cc.  Out of 12 core biopsies, 11 were positive.  The maximum Gleason score was 4+5, and this was seen in the right mid and right apex.  Additionally, Gleason 4+3 was seen in the right base, right mid lateral, right apex lateral, left apex and left apex lateral and Gleason 3+4 in the right base lateral, left base, left mid and left base lateral.  He had a bone scan and CT A/P for disease staging.  The bone scan was performed on 05/16/2022 and showed multiple areas of uptake throughout the cervical, thoracic and lumbar spine but no definite osseous metastases.  The CT A/P performed on 05/24/2022 was without any evidence of metastatic disease, noting degenerative  changes in the spine.  The patient reviewed the biopsy results with his urologist and he has kindly been referred today for discussion of potential radiation treatment options.   PREVIOUS RADIATION THERAPY: No  PAST MEDICAL HISTORY:  Past Medical History:  Diagnosis Date   Diabetes mellitus without complication ([place])    Hep C w/o coma, chronic ([place]) 08/2016      PAST SURGICAL HISTORY:No past surgical history on file.  FAMILY HISTORY:  Family History  Family history unknown: Yes    SOCIAL HISTORY:  Social History   Socioeconomic History   Marital status: Married    Spouse name: Not on file   Number of children: Not on file   Years of education: Not on file   Highest education level: Not on file  Occupational History   Not on file  Tobacco Use   Smoking status: Every Day    Packs/day: 1.00    Types: Cigarettes    Start date: 04/29/1966   Smokeless tobacco: Never  Substance and Sexual Activity   Alcohol use: Yes    Alcohol/week: 0.0 standard drinks of alcohol   Drug use: Not on file   Sexual activity: Not on file  Other Topics Concern   Not on file  Social History Narrative   Not on file   Social Determinants of Health   Financial Resource Strain: Not on file  Food Insecurity: Not on file  Transportation Needs: Not on  file  Physical Activity: Not on file  Stress: Not on file  Social Connections: Not on file  Intimate Partner Violence: Not on file    ALLERGIES: Patient has no known allergies.  MEDICATIONS:  Current Outpatient Medications  Medication Sig Dispense Refill   rosuvastatin (CRESTOR) 10 MG tablet Take by mouth.     amLODipine (NORVASC) 5 MG tablet Take 5 mg by mouth daily.     aspirin EC 81 MG tablet Take 81 mg by mouth daily. Pt takes occasionally     lisinopril (ZESTRIL) 40 MG tablet Take 40 mg by mouth daily.     No current facility-administered medications for this encounter.    REVIEW OF SYSTEMS:  On review of systems, the patient  reports that he is doing well overall. He denies any chest pain, shortness of breath, cough, fevers, chills, night sweats, unintended weight changes. He denies any bowel disturbances, and denies abdominal pain, nausea or vomiting. He denies any new musculoskeletal or joint aches or pains. His IPSS was Total Score: 3, indicating mild urinary symptoms (Reference 0-7 mild, 8-19 moderate, 20-35 severe).  His SHIM was SHIM: 5, indicating he has severe erectile dysfunction (Reference - 22-25 None, 17-21 Mild, 8-16 Moderate, 1-7 Severe). A complete review of systems is obtained and is otherwise negative.    PHYSICAL EXAM:  Wt Readings from Last 3 Encounters:  05/30/22 175 lb 6 oz (79.5 kg)  12/08/16 173 lb 9.6 oz (78.7 kg)  04/29/16 172 lb (78 kg)   Temp Readings from Last 3 Encounters:  05/30/22 98 F (36.7 C) (Oral)  04/20/22 97.8 F (36.6 C) (Oral)  09/12/20 97.9 F (36.6 C) (Tympanic)   BP Readings from Last 3 Encounters:  05/30/22 (!) 145/71  04/27/22 131/75  04/20/22 (!) 148/74   Pulse Readings from Last 3 Encounters:  05/30/22 (!) 58  04/27/22 60  04/20/22 60   Pain Assessment Pain Score: 0-No pain/10  In general this is a well appearing African-American male in no acute distress. He's alert and oriented x4 and appropriate throughout the examination. Cardiopulmonary assessment is negative for acute distress, and he exhibits normal effort.     KPS = 100  100 - Normal; no complaints; no evidence of disease. 90   - Able to carry on normal activity; minor signs or symptoms of disease. 80   - Normal activity with effort; some signs or symptoms of disease. 72   - Cares for self; unable to carry on normal activity or to do active work. 60   - Requires occasional assistance, but is able to care for most of his personal needs. 50   - Requires considerable assistance and frequent medical care. 94   - Disabled; requires special care and assistance. 20   - Severely disabled; hospital  admission is indicated although death not imminent. 3   - Very sick; hospital admission necessary; active supportive treatment necessary. 10   - Moribund; fatal processes progressing rapidly. 0     - Dead  Karnofsky DA, Abelmann Clifton, Craver LS and Burchenal [facility] [phone]) The use of the nitrogen mustards in the palliative treatment of carcinoma: with particular reference to bronchogenic carcinoma Cancer 1 [id number]  LABORATORY DATA:  Lab Results  Component Value Date   WBC 8.8 12/08/2016   HGB 15.6 12/08/2016   HCT 45.9 12/08/2016   MCV 92.4 12/08/2016   PLT 217 12/08/2016   Lab Results  Component Value Date   NA 144 04/27/2022   K 4.5 04/27/2022  CL 105 04/27/2022   CO2 22 04/27/2022   No results found for: "ALT", "AST", "GGT", "ALKPHOS", "BILITOT"   RADIOGRAPHY: CT Abdomen Pelvis W Wo Contrast  Result Date: 05/25/2022 CLINICAL DATA:  Prostate cancer, high risk. Staging. * Tracking Code: BO * EXAM: CT ABDOMEN AND PELVIS WITHOUT AND WITH CONTRAST TECHNIQUE: Multidetector CT imaging of the abdomen and pelvis was performed following the standard protocol before and following the bolus administration of intravenous contrast. RADIATION DOSE REDUCTION: This exam was performed according to the departmental dose-optimization program which includes automated exposure control, adjustment of the mA and/or kV according to patient size and/or use of iterative reconstruction technique. CONTRAST:  167m OMNIPAQUE IOHEXOL 300 MG/ML  SOLN COMPARISON:  Nuclear medicine bone scan dated May 16, 2022. FINDINGS: Lower chest: No acute abnormality. Hepatobiliary: No suspicious hepatic lesion. Pharyngeal cap on the gallbladder. No biliary ductal dilation. Pancreas: Pancreatic ductal dilation or evidence of acute inflammation. Spleen: No splenomegaly. Adrenals/Urinary Tract: Bilateral adrenal glands appear normal. No hydronephrosis. No renal, ureteral or bladder calculi identified. Hypodense 15 mm left upper pole renal  cyst and tiny bilateral hypodense renal lesions which are technically too small to accurately characterize but statistically likely reflect cysts are considered benign and require no independent imaging follow-up. No suspicious renal mass. Kidneys demonstrate symmetric enhancement and excretion of contrast material. Urinary bladder is unremarkable for degree of distension. Stomach/Bowel: No radiopaque enteric contrast material was administered. Stomach is unremarkable for degree of distension. No pathologic dilation of large or small bowel. Normal appendix and terminal ileum. Sigmoid colonic diverticulosis without findings of acute diverticulitis. Vascular/Lymphatic: Aortic and branch vessel atherosclerosis without aneurysmal dilation. No pathologically enlarged abdominal or pelvic lymph nodes. Reproductive: Heterogeneous enhancement of the prostate gland poorly evaluated on CT but can be seen with patient's known prostate cancer. Other: No significant abdominopelvic free fluid. Musculoskeletal: No aggressive lytic or blastic lesion of bone. Multilevel degenerative changes spine IMPRESSION: 1. Heterogeneous enhancement of the prostate gland is poorly evaluated on CT but is consistent with patient's known prostate cancer. 2. No evidence of metastatic disease in the abdomen or pelvis. 3. Sigmoid colonic diverticulosis without findings of acute diverticulitis. 4.  Aortic Atherosclerosis (ICD10-I70.0). Electronically Signed   By: JDahlia BailiffM.D.   On: 05/25/2022 09:55   NM Bone Scan Whole Body  Result Date: 05/17/2022 CLINICAL DATA:  Prostate cancer. EXAM: NUCLEAR MEDICINE WHOLE BODY BONE SCAN TECHNIQUE: Whole body anterior and posterior images were obtained approximately 3 hours after intravenous injection of radiopharmaceutical. RADIOPHARMACEUTICALS:  20.6 mCi Technetium-974mDP IV COMPARISON:  CT abdomen pelvis report 08/24/2016. FINDINGS: Bilateral renal function excretion. Increased activity noted over both  shoulders and both sternoclavicular joints, both hips, both knees, and both ankles. These findings are most likely degenerative. Multiple areas of increased activity noted throughout the cervical, thoracic, and lumbar spine. Although these changes may be degenerative, metastatic disease cannot be excluded. Further evaluation with plain films of the cervical, thoracic, lumbar spine should be considered. IMPRESSION: 1.  Diffuse degenerative change. 2. Multiple areas of increased activity noted throughout the cervical, thoracic, and lumbar spine. Although these changes may be degenerative, metastatic disease cannot be excluded. Further evaluation with plain films of the cervical, thoracic, lumbar spine should be considered. Electronically Signed   By: ThMarcello MooresRegister M.D.   On: 05/17/2022 09:23      IMPRESSION/PLAN: 1. [age].o. gentleman with Stage T1c adenocarcinoma of the prostate with Gleason Score of 4+5, and PSA of 12. We discussed the patient's workup  and outlined the nature of prostate cancer in this setting. The patient's T stage, Gleason's score, and PSA put him into the high risk group. Accordingly, he is eligible for a variety of potential treatment options including prostatectomy or LT-ADT in combination with either 8 weeks of external radiation or 5 weeks of external radiation with an upfront brachytherapy boost. We discussed the available radiation techniques, and focused on the details and logistics of delivery. We discussed and outlined the risks, benefits, short and long-term effects associated with radiotherapy and compared and contrasted these with prostatectomy. We discussed the role of SpaceOAR gel in reducing the rectal toxicity associated with radiotherapy. We also detailed the role of ADT in the treatment of high risk prostate cancer and outlined the associated side effects that could be expected with this therapy.  He appears to have a good understanding of his disease and our treatment  recommendations which are of curative intent.  He was encouraged to ask questions that were answered to his stated satisfaction.  At the conclusion of our conversation, the patient is interested in moving forward with LT-ADT, Seed Boost and IMRT.  I personally spent 60 minutes in this encounter including chart review, reviewing radiological studies, meeting face-to-face with the patient, entering orders and completing documentation.   ------------------------------------------------   Tyler Pita, MD [place]: [phone]  Fax: [phone] [email or web address]  Skype  [email or web address]

## 2022-05-30 ENCOUNTER — Ambulatory Visit
Admission: RE | Admit: 2022-05-30 | Discharge: 2022-05-30 | Disposition: A | Discharge status: Home / Self Care | Payer: PPO | Source: Ambulatory Visit | Attending: Radiation Oncology | Admitting: Radiation Oncology

## 2022-05-30 ENCOUNTER — Other Ambulatory Visit: Payer: Self-pay

## 2022-05-30 DIAGNOSIS — K5730 Diverticulosis of large intestine without perforation or abscess without bleeding: Secondary | ICD-10-CM | POA: Diagnosis not present

## 2022-05-30 DIAGNOSIS — F17210 Nicotine dependence, cigarettes, uncomplicated: Secondary | ICD-10-CM | POA: Insufficient documentation

## 2022-05-30 DIAGNOSIS — B182 Chronic viral hepatitis C: Secondary | ICD-10-CM | POA: Insufficient documentation

## 2022-05-30 DIAGNOSIS — C61 Malignant neoplasm of prostate: Secondary | ICD-10-CM

## 2022-05-30 DIAGNOSIS — I700 Atherosclerosis of aorta: Secondary | ICD-10-CM | POA: Insufficient documentation

## 2022-05-30 DIAGNOSIS — K8689 Other specified diseases of pancreas: Secondary | ICD-10-CM | POA: Insufficient documentation

## 2022-05-30 DIAGNOSIS — I10 Essential (primary) hypertension: Secondary | ICD-10-CM | POA: Diagnosis not present

## 2022-05-30 DIAGNOSIS — Z7982 Long term (current) use of aspirin: Secondary | ICD-10-CM | POA: Insufficient documentation

## 2022-05-30 DIAGNOSIS — Z79899 Other long term (current) drug therapy: Secondary | ICD-10-CM | POA: Diagnosis not present

## 2022-05-30 DIAGNOSIS — E119 Type 2 diabetes mellitus without complications: Secondary | ICD-10-CM | POA: Insufficient documentation

## 2022-05-30 DIAGNOSIS — Z191 Hormone sensitive malignancy status: Secondary | ICD-10-CM | POA: Diagnosis not present

## 2022-05-30 NOTE — Progress Notes (Signed)
Introduced myself to the patient as the prostate nurse navigator.  No barriers to care identified at this time.  He is here to discuss his radiation treatment options.  I gave him my business card and asked him to call me with questions or concerns.  Verbalized understanding.  ?

## 2022-05-31 ENCOUNTER — Encounter: Payer: Self-pay | Admitting: Urology

## 2022-05-31 ENCOUNTER — Ambulatory Visit: Payer: PPO | Admitting: Urology

## 2022-05-31 VITALS — BP 128/65 | HR 59 | Ht 67.0 in | Wt 175.0 lb

## 2022-05-31 DIAGNOSIS — C61 Malignant neoplasm of prostate: Secondary | ICD-10-CM | POA: Diagnosis not present

## 2022-05-31 LAB — URINALYSIS, ROUTINE W REFLEX MICROSCOPIC
Bilirubin, UA: NEGATIVE
Glucose, UA: NEGATIVE
Leukocytes,UA: NEGATIVE
Nitrite, UA: NEGATIVE
Specific Gravity, UA: 1.025 (ref 1.005–1.030)
Urobilinogen, Ur: 1 mg/dL (ref 0.2–1.0)
pH, UA: 5 (ref 5.0–7.5)

## 2022-05-31 LAB — MICROSCOPIC EXAMINATION
Epithelial Cells (non renal): NONE SEEN /hpf (ref 0–10)
Renal Epithel, UA: NONE SEEN /hpf
WBC, UA: NONE SEEN /hpf (ref 0–5)

## 2022-05-31 MED ORDER — DEGARELIX ACETATE(240 MG DOSE) 120 MG/VIAL ~~LOC~~ SOLR
240.0000 mg | Freq: Once | SUBCUTANEOUS | Status: AC
  Administered 2022-05-31: 240 mg via SUBCUTANEOUS

## 2022-05-31 NOTE — Progress Notes (Signed)
Firmagon Sub Q Injection  Due to Prostate Cancer patient is present today for a Firmagon Injection.   Medication: Mills Koller (Degarelix)  Dose: '240mg'$  Location: right & right upper abdomen  Patient tolerated well, no complications were noted  Performed by: Nedra Mcinnis LPN  Follow up: Per MD note

## 2022-05-31 NOTE — Patient Instructions (Signed)

## 2022-05-31 NOTE — Progress Notes (Signed)
05/31/2022 11:12 AM   Spencer Mills June 08, 1951 [id number]  Referring provider: Monico Blitz, MD [place],  [place] [id number]  Prostate cancer   HPI: Spencer Mills is a 71yo here for followup for prostate cancer. He met with Dr. Tammi Klippel and has elected to proceed with brachytherapy. No worsening LUTS. No other complaints today   PMH: Past Medical History:  Diagnosis Date   Diabetes mellitus without complication ([place])    Hep C w/o coma, chronic ([place]) 08/2016    Surgical History: No past surgical history on file.  Home Medications:  Allergies as of 05/31/2022   No Known Allergies      Medication List        Accurate as of May 31, 2022 11:12 AM. If you have any questions, ask your nurse or doctor.          amLODipine 5 MG tablet Commonly known as: NORVASC Take 5 mg by mouth daily.   aspirin EC 81 MG tablet Take 81 mg by mouth daily. Pt takes occasionally   lisinopril 40 MG tablet Commonly known as: ZESTRIL Take 40 mg by mouth daily.   rosuvastatin 10 MG tablet Commonly known as: CRESTOR Take by mouth.        Allergies: No Known Allergies  Family History: Family History  Family history unknown: Yes    Social History:  reports that he has been smoking cigarettes. He started smoking about 56 years ago. He has been smoking an average of 1 pack per day. He has never used smokeless tobacco. He reports current alcohol use. No history on file for drug use.  ROS: All other review of systems were reviewed and are negative except what is noted above in HPI  Physical Exam: BP 128/65   Pulse (!) 59   Ht _0  (1.702 m)   Wt 175 lb (79.4 kg)   BMI 27.41 kg/m   Constitutional:  Alert and oriented, No acute distress. HEENT: [place] AT, moist mucus membranes.  Trachea midline, no masses. Cardiovascular: No clubbing, cyanosis, or edema. Respiratory: Normal respiratory effort, no increased work of breathing. GI: Abdomen is soft, nontender, nondistended, no  abdominal masses GU: No CVA tenderness.  Lymph: No cervical or inguinal lymphadenopathy. Skin: No rashes, bruises or suspicious lesions. Neurologic: Grossly intact, no focal deficits, moving all 4 extremities. Psychiatric: Normal mood and affect.  Laboratory Data: Lab Results  Component Value Date   WBC 8.8 12/08/2016   HGB 15.6 12/08/2016   HCT 45.9 12/08/2016   MCV 92.4 12/08/2016   PLT 217 12/08/2016    Lab Results  Component Value Date   CREATININE 1.21 04/27/2022    No results found for: "PSA"  No results found for: "TESTOSTERONE"  No results found for: "HGBA1C"  Urinalysis    Component Value Date/Time   APPEARANCEUR Clear 03/23/2022 1022   GLUCOSEU Negative 03/23/2022 1022   BILIRUBINUR Negative 03/23/2022 1022   PROTEINUR 3+ (A) 03/23/2022 1022   NITRITE Negative 03/23/2022 1022   LEUKOCYTESUR Negative 03/23/2022 1022    Lab Results  Component Value Date   LABMICR See below: 03/23/2022   WBCUA None seen 03/23/2022   LABEPIT 0-10 03/23/2022   BACTERIA None seen 03/23/2022    Pertinent Imaging:  No results found for this or any previous visit.  No results found for this or any previous visit.  No results found for this or any previous visit.  No results found for this or any previous visit.  No results  found for this or any previous visit.  No results found for this or any previous visit.  No results found for this or any previous visit.  No results found for this or any previous visit.   Assessment & Plan:    1. Prostate cancer ([place]) -Schedule for brachytherapy with [facility] -Firmagon 244m today. RTc 1 month with testosterone and PSA - Urinalysis, Routine w reflex microscopic   No follow-ups on file.  PNicolette Bang MD  [facility] [place]

## 2022-06-02 ENCOUNTER — Telehealth: Payer: Self-pay

## 2022-06-02 ENCOUNTER — Telehealth: Payer: Self-pay | Admitting: *Deleted

## 2022-06-02 NOTE — Progress Notes (Signed)
Received call from [place], office staff for Dr Tammi Klippel (cancer center).  Inquiring if pt needs ekg prior to surgery scheduled on 09-15-2022 by Dr Alyson Ingles '@[email or web address]'$ .  Per anesthesia guidelines pt would need ekg done, last one in epic greater than one year.

## 2022-06-02 NOTE — Telephone Encounter (Signed)
CALLED PATIENT TO INFORM OF PRE-SEED APPTS. AND IMPLANT DATE, LVM FOR A RETURN CALL

## 2022-06-02 NOTE — Telephone Encounter (Signed)
I spoke with Spencer Mills. We have discussed possible surgery dates and 09/15/2022 was agreed upon by all parties. Patient given information about surgery date, what to expect pre-operatively and post operatively.    We discussed that a pre-op nurse will be calling to set up the pre-op visit that will take place prior to surgery. Informed patient that our office will communicate any additional care to be provided after surgery.    Patients questions or concerns were discussed during our call. Advised to call our office should there be any additional information, questions or concerns that arise. Patient verbalized understanding.

## 2022-06-03 ENCOUNTER — Telehealth: Payer: Self-pay | Admitting: *Deleted

## 2022-06-03 NOTE — Telephone Encounter (Signed)
CALLED PATIENT TO INFORM OF PRE-SEED APPTS. FOR 07-21-22 AND HIS IMPLANT FOR 09-15-22, SPOKE WITH PATIENT AND HE IS AWARE OF THESE APPTS.

## 2022-06-29 DIAGNOSIS — I10 Essential (primary) hypertension: Secondary | ICD-10-CM | POA: Diagnosis not present

## 2022-07-05 ENCOUNTER — Other Ambulatory Visit: Payer: PPO

## 2022-07-05 DIAGNOSIS — C61 Malignant neoplasm of prostate: Secondary | ICD-10-CM

## 2022-07-06 ENCOUNTER — Ambulatory Visit: Payer: PPO | Admitting: Urology

## 2022-07-06 LAB — TESTOSTERONE: Testosterone: <3 ng/dL — ABNORMAL LOW (ref 264–916)

## 2022-07-06 LAB — PSA: Prostate Specific Ag, Serum: 1.2 ng/mL (ref 0.0–4.0)

## 2022-07-12 ENCOUNTER — Ambulatory Visit: Payer: PPO | Admitting: Urology

## 2022-07-12 ENCOUNTER — Encounter: Payer: Self-pay | Admitting: Urology

## 2022-07-12 VITALS — BP 124/71 | HR 60

## 2022-07-12 DIAGNOSIS — C61 Malignant neoplasm of prostate: Secondary | ICD-10-CM

## 2022-07-12 LAB — URINALYSIS, ROUTINE W REFLEX MICROSCOPIC
Bilirubin, UA: NEGATIVE
Glucose, UA: NEGATIVE
Ketones, UA: NEGATIVE
Leukocytes,UA: NEGATIVE
Nitrite, UA: NEGATIVE
Specific Gravity, UA: 1.025 (ref 1.005–1.030)
Urobilinogen, Ur: 0.2 mg/dL (ref 0.2–1.0)
pH, UA: 5 (ref 5.0–7.5)

## 2022-07-12 LAB — MICROSCOPIC EXAMINATION
Renal Epithel, UA: NONE SEEN /hpf
WBC, UA: NONE SEEN /hpf (ref 0–5)

## 2022-07-12 MED ORDER — LEUPROLIDE ACETATE (6 MONTH) 45 MG ~~LOC~~ KIT
45.0000 mg | PACK | Freq: Once | SUBCUTANEOUS | Status: AC
  Administered 2022-07-12: 45 mg via SUBCUTANEOUS

## 2022-07-12 NOTE — Progress Notes (Signed)
Eligard SubQ Injection  ? ?Due to Prostate Cancer patient is present today for a Eligard Injection. ? ?Medication: Eligard 6 month ?Dose: 45 mg  ?Location: right  ? ? ?Patient tolerated well, no complications were noted ? ?Performed by: Gabrielle Mester LPN ? ?

## 2022-07-12 NOTE — Patient Instructions (Signed)
Brachytherapy for Prostate Cancer Brachytherapy for prostate cancer is a type of internal radiation treatment that involves placing a source of radiation right inside the prostate gland. This allows the delivery of a higher dose of radiation than would be possible with external radiation therapy treatment. There are many types of brachytherapy: Low-dose rate (LDR) therapy. This involves temporary or permanent implants of radioactive seeds or pellets that give off a low dose of radiation. Temporary low-dose implants are left in the prostate for 1-7 days. The radioactive material is contained within a delivery tool, which may be a needle, a small, thin tube (catheter), or another type of applicator. You will need to stay in the hospital while the delivery tool and radioactive material are in place. Permanent low-dose implants are left in the prostate. They slowly give off radiation for many months after they are inserted. After the radiation is gone, they just stay in the body. They do not cause any harm and are not removed. High-dose rate (HDR) therapy. This involves inserting a material that gives off a higher dose of radiation for only a few minutes. The radioactive material is often wires or ribbons contained within a delivery tool (needle, applicator, or catheter). The delivery tool is removed after treatment, and no radioactive material is left in the prostate. In brachytherapy, the radiation does not travel far from the prostate, so healthy tissues around the prostate receive only a small dose of radiation. This helps to protect those tissues from injury. In some cases, brachytherapy may be given along with external beam radiation. Tell a health care provider about: Any allergies you have. All medicines you are taking, including vitamins, herbs, eye drops, creams, and over-the-counter medicines. Any problems you or family members have had with anesthetic medicines. Any bleeding problems you  have. Any surgeries you have had. Any medical conditions you have. Any prostate infections you have had. What are the risks? Generally, this is a safe procedure. However, problems may occur, including: Inflammation of the rectum. Problems getting or keeping an erection (erectile dysfunction). Inability to control when you urinate or have bowel movements (incontinence). Damage to nearby structures or organs. Diarrhea. Bleeding. What happens before the procedure? Staying hydrated Follow instructions from your health care provider about hydration, which may include: Up to 2 hours before the procedure - you may continue to drink clear liquids, such as water, clear fruit juice, black coffee, and plain tea.  Eating and drinking restrictions Follow instructions from your health care provider about eating and drinking, which may include: 8 hours before the procedure - stop eating heavy meals or foods, such as meat, fried foods, or fatty foods. 6 hours before the procedure - stop eating light meals or foods, such as toast or cereal. 6 hours before the procedure - stop drinking milk or drinks that contain milk. 2 hours before the procedure - stop drinking clear liquids. Medicines Ask your health care provider about: Changing or stopping your regular medicines. This is especially important if you are taking diabetes medicines or blood thinners. Taking medicines such as aspirin and ibuprofen. These medicines can thin your blood. Do not take these medicines unless your health care provider tells you to take them. Taking over-the-counter medicines, vitamins, herbs, and supplements. Follow your health care provider's instructions about cleaning out your bowels. Surgery safety Ask your health care provider: How your surgery site will be marked. What steps will be taken to help prevent infection. These may include: Removing hair at the procedure site.   Washing skin with a germ-killing soap. Taking  antibiotic medicine before and after the procedure. General instructions You may have exams or testing done before or after the procedure. Blood or urine samples may be taken. You may need imaging tests, such as a CT scan or an MRI. Do not use any products that contain nicotine or tobacco for at least 4 weeks before the procedure. This includes cigarettes, chewing tobacco, and vaping devices, such as e-cigarettes. If you need help quitting, ask your health care provider. Plan to have a responsible adult take you home from the hospital or clinic. Plan to have a responsible adult care for you for the time you are told after you leave the hospital or clinic. What happens during the procedure? An IV will be put into one of the veins in your arm or hand. You may be given: A medicine to help you relax (sedative). A medicine to numb the area (local anesthetic). A medicine to make you fall asleep (general anesthetic). A thin, flexible tube (Foley catheter) might be put into your penis, through your urethra, and into your bladder to drain your urine. Your surgeon will insert the radioactive material. The method used will depend on whether you are receiving temporary or permanent brachytherapy. Temporary low-dose or high-dose brachytherapy A delivery tool (needle, applicator, or catheter) will be put into the prostate. It will be inserted through a body cavity, like the rectum, or through the perineum, which is the area beneath the scrotum. An X-ray, ultrasound, MRI, or CT scan will be used to guide the delivery tool into the prostate. Radioactive seeds, pellets, wires, or ribbons will be fed through the delivery tool. If the high-dose method is used: The radioactive material will be left in for a few minutes and then removed. When the treatment is finished, the delivery tool will be removed. If the low-dose method is used: The delivery tool containing the radioactive material will stay in place for 1-7  days. You will stay in the hospital while the implant is in place. When the treatment is finished, the radioactive material and delivery tool will be removed. Permanent low-dose brachytherapy  A tube or needle will be used to inject small, radioactive seeds or pellets into your prostate. The needle or tube will be removed, leaving the seeds or pellets in the prostate. The procedure may vary among health care providers and hospitals. What happens after the procedure? Your blood pressure, heart rate, breathing rate, and blood oxygen level will be monitored until you leave the hospital or clinic. If you were given a sedative during the procedure, it can affect you for several hours. Do not drive or operate machinery until your health care provider says that it is safe. If radiation seeds were left in your prostate, be sure you understand any safety precautions you are to follow at home. Summary Brachytherapy for prostate cancer is a type of radiation treatment that involves placing a source of radiation right inside the prostate gland. There are several types of brachytherapy for prostate cancer: low-dose temporary treatment, low-dose permanent treatment, and high-dose temporary treatment. The amount of time that the source of radiation is left in your prostate will depend on the type of brachytherapy you are having. This information is not intended to replace advice given to you by your health care provider. Make sure you discuss any questions you have with your health care provider. Document Revised: 01/13/2021 Document Reviewed: 01/13/2021 Elsevier Patient Education  2023 [employer]  

## 2022-07-12 NOTE — Progress Notes (Signed)
07/12/2022 10:37 AM   Spencer Mills November 24, 1950 [id number]  Referring provider: Monico Blitz, MD [place],  [place] [address]  Followup prostate cancer   HPI: [name] is a 71yo here for followup for prostate cancer. He is scheduled for brachytherapy on 09/15/2022. He had firmagon '240mg'$  last visit. Testosterone <3, PSA 1.2. He has hot flashes which do not bother him. He denies any significant LUTS.    PMH: Past Medical History:  Diagnosis Date   Diabetes mellitus without complication ([place])    Hep C w/o coma, chronic ([place]) 08/2016    Surgical History: No past surgical history on file.  Home Medications:  Allergies as of 07/12/2022   No Known Allergies      Medication List        Accurate as of July 12, 2022 10:37 AM. If you have any questions, ask your nurse or doctor.          amLODipine 5 MG tablet Commonly known as: NORVASC Take 5 mg by mouth daily.   aspirin EC 81 MG tablet Take 81 mg by mouth daily. Pt takes occasionally   lisinopril 40 MG tablet Commonly known as: ZESTRIL Take 40 mg by mouth daily.   rosuvastatin 10 MG tablet Commonly known as: CRESTOR Take by mouth.        Allergies: No Known Allergies  Family History: Family History  Family history unknown: Yes    Social History:  reports that he has been smoking cigarettes. He started smoking about 56 years ago. He has been smoking an average of 1 pack per day. He has never used smokeless tobacco. He reports current alcohol use. No history on file for drug use.  ROS: All other review of systems were reviewed and are negative except what is noted above in HPI  Physical Exam: BP 124/71   Pulse 60   Constitutional:  Alert and oriented, No acute distress. HEENT:  AT, moist mucus membranes.  Trachea midline, no masses. Cardiovascular: No clubbing, cyanosis, or edema. Respiratory: Normal respiratory effort, no increased work of breathing. GI: Abdomen is soft, nontender,  nondistended, no abdominal masses GU: No CVA tenderness.  Lymph: No cervical or inguinal lymphadenopathy. Skin: No rashes, bruises or suspicious lesions. Neurologic: Grossly intact, no focal deficits, moving all 4 extremities. Psychiatric: Normal mood and affect.  Laboratory Data: Lab Results  Component Value Date   WBC 8.8 12/08/2016   HGB 15.6 12/08/2016   HCT 45.9 12/08/2016   MCV 92.4 12/08/2016   PLT 217 12/08/2016    Lab Results  Component Value Date   CREATININE 1.21 04/27/2022    No results found for: "PSA"  Lab Results  Component Value Date   TESTOSTERONE <3 (L) 07/05/2022    No results found for: "HGBA1C"  Urinalysis    Component Value Date/Time   APPEARANCEUR Clear 05/31/2022 1044   GLUCOSEU Negative 05/31/2022 1044   BILIRUBINUR Negative 05/31/2022 1044   PROTEINUR 3+ (A) 05/31/2022 1044   NITRITE Negative 05/31/2022 1044   LEUKOCYTESUR Negative 05/31/2022 1044    Lab Results  Component Value Date   LABMICR See below: 05/31/2022   WBCUA None seen 05/31/2022   LABEPIT None seen 05/31/2022   MUCUS Present 05/31/2022   BACTERIA Few 05/31/2022    Pertinent Imaging:  No results found for this or any previous visit.  No results found for this or any previous visit.  No results found for this or any previous visit.  No results found for  this or any previous visit.  No results found for this or any previous visit.  No results found for this or any previous visit.  No results found for this or any previous visit.  No results found for this or any previous visit.   Assessment & Plan:    1. Prostate cancer ([place]) Eligard '45mg'$  today -Patient scheduled for brachytherapy on 09/15/2022 - Urinalysis, Routine w reflex microscopic   No follow-ups on file.  Nicolette Bang, MD  [facility] [place]

## 2022-07-19 ENCOUNTER — Telehealth: Payer: Self-pay | Admitting: *Deleted

## 2022-07-19 NOTE — Telephone Encounter (Signed)
CALLED PATIENT TO REMIND OF PRE-SEED APPTS. FOR 07-21-22, SPOKE WITH PATIENT AND HE IS AWARE OF THESE APPTS.

## 2022-07-20 NOTE — Progress Notes (Signed)
Radiation Oncology         [phone]) [phone] ________________________________  Outpatient Follow up- Pre-seed visit  Name: Spencer Mills MRN: [id number]  Date: 07/21/2022  DOB: 17-Jul-1951  TJ:QZES, Spencer Picking, MD  McKenzie, Spencer Furbish, MD   REFERRING PHYSICIAN: Cleon Gustin, MD  DIAGNOSIS: 71 y.o. gentleman with Stage T1c adenocarcinoma of the prostate with Gleason score of 4+5, and PSA of 12.    ICD-10-CM   1. Malignant neoplasm of prostate ([place])  C61       HISTORY OF PRESENT ILLNESS: Spencer Mills is a 71 y.o. male with a diagnosis of prostate cancer. He has a history of elevated PSA since at least 2020, previously seen by Dr. Alyson Mills in August 2020 with a PSA at 8.6 in June 2020.  He denied any LUTS and DRE at that time was normal so the patient elected to continue with PSA surveillance.  More recently, he was noted to have further elevated PSA of 12 by his primary care physician, Dr. Manuella Mills.  Accordingly, he was referred back to urology for further evaluation with Dr. Alyson Mills on 03/23/2022,  digital rectal examination was performed at that time revealing a smooth, firm gland without any discrete nodularity noted.  The patient proceeded to transrectal ultrasound with 12 biopsies of the prostate on 04/20/2022.  The prostate volume measured 28.9 cc.  Out of 12 core biopsies, 11 were positive.  The maximum Gleason score was 4+5, and this was seen in the right mid and right apex.  Additionally, Gleason 4+3 was seen in the right base, right mid lateral, right apex lateral, left apex and left apex lateral and Gleason 3+4 in the right base lateral, left base, left mid and left base lateral.   He had a bone scan and CT A/P for disease staging.  The bone scan was performed on 05/16/2022 and showed multiple areas of uptake throughout the cervical, thoracic and lumbar spine but no definite osseous metastases.  The CT A/P performed on 05/24/2022 was without any evidence of metastatic disease, noting  degenerative changes in the spine.  The patient reviewed the biopsy results with his urologist and was kindly referred to [place] for discussion of potential radiation treatment options. We initially met the patient on 05/30/2022 and he was most interested in moving forward with LT-ADT concurrent with brachytherapy seed boost with SpaceOAR gel placement followed by a 5 week course of daily prostate IMRT for treatment of his disease. His ADT was started on 05/31/2022. He is here today for his pre-procedure imaging for planning and to answer any additional questions he may have about this treatment.   PREVIOUS RADIATION THERAPY: No  PAST MEDICAL HISTORY:  Past Medical History:  Diagnosis Date   Diabetes mellitus without complication ([place])    Hep C w/o coma, chronic ([place]) 08/2016      PAST SURGICAL HISTORY:No past surgical history on file.  FAMILY HISTORY:  Family History  Family history unknown: Yes    SOCIAL HISTORY:  Social History   Socioeconomic History   Marital status: Married    Spouse name: Not on file   Number of children: Not on file   Years of education: Not on file   Highest education level: Not on file  Occupational History   Not on file  Tobacco Use   Smoking status: Every Day    Packs/day: 1.00    Types: Cigarettes    Start date: 04/29/1966   Smokeless tobacco: Never  Substance and Sexual Activity  Alcohol use: Yes    Alcohol/week: 0.0 standard drinks of alcohol   Drug use: Not on file   Sexual activity: Not on file  Other Topics Concern   Not on file  Social History Narrative   Not on file   Social Determinants of Health   Financial Resource Strain: Not on file  Food Insecurity: Not on file  Transportation Needs: Not on file  Physical Activity: Not on file  Stress: Not on file  Social Connections: Not on file  Intimate Partner Violence: Not on file    ALLERGIES: Patient has no known allergies.  MEDICATIONS:  Current Outpatient Medications   Medication Sig Dispense Refill   amLODipine (NORVASC) 5 MG tablet Take 5 mg by mouth daily.     aspirin EC 81 MG tablet Take 81 mg by mouth daily. Pt takes occasionally     lisinopril (ZESTRIL) 40 MG tablet Take 40 mg by mouth daily.     rosuvastatin (CRESTOR) 10 MG tablet Take by mouth.     No current facility-administered medications for this visit.    REVIEW OF SYSTEMS:   On review of systems, the patient reports that he is doing well overall. He denies any chest pain, shortness of breath, cough, fevers, chills, night sweats, unintended weight changes. He denies any bowel disturbances, and denies abdominal pain, nausea or vomiting. He denies any new musculoskeletal or joint aches or pains. His IPSS was Total Score: 3, indicating mild urinary symptoms (Reference 0-7 mild, 8-19 moderate, 20-35 severe).  His SHIM was SHIM: 5, indicating he has severe erectile dysfunction (Reference - 22-25 None, 17-21 Mild, 8-16 Moderate, 1-7 Severe). A complete review of systems is obtained and is otherwise negative.    PHYSICAL EXAM:  Wt Readings from Last 3 Encounters:  05/31/22 175 lb (79.4 kg)  05/30/22 175 lb 6 oz (79.5 kg)  12/08/16 173 lb 9.6 oz (78.7 kg)   Temp Readings from Last 3 Encounters:  05/30/22 98 F (36.7 C) (Oral)  04/20/22 97.8 F (36.6 C) (Oral)  09/12/20 97.9 F (36.6 C) (Tympanic)   BP Readings from Last 3 Encounters:  07/12/22 124/71  05/31/22 128/65  05/30/22 (!) 145/71   Pulse Readings from Last 3 Encounters:  07/12/22 60  05/31/22 (!) 59  05/30/22 (!) 58    /10  In general this is a well appearing African-American male in no acute distress. He's alert and oriented x4 and appropriate throughout the examination. Cardiopulmonary assessment is negative for acute distress, and he exhibits normal effort.     KPS = 100  100 - Normal; no complaints; no evidence of disease. 90   - Able to carry on normal activity; minor signs or symptoms of disease. 80   - Normal  activity with effort; some signs or symptoms of disease. 56   - Cares for self; unable to carry on normal activity or to do active work. 60   - Requires occasional assistance, but is able to care for most of his personal needs. 50   - Requires considerable assistance and frequent medical care. 63   - Disabled; requires special care and assistance. 1   - Severely disabled; hospital admission is indicated although death not imminent. 66   - Very sick; hospital admission necessary; active supportive treatment necessary. 10   - Moribund; fatal processes progressing rapidly. 0     - Dead  Karnofsky DA, Abelmann WH, Craver LS and Burchenal [facility] [phone]) The use of the nitrogen mustards in the  palliative treatment of carcinoma: with particular reference to bronchogenic carcinoma Cancer 1 634-56  LABORATORY DATA:  Lab Results  Component Value Date   WBC 8.8 12/08/2016   HGB 15.6 12/08/2016   HCT 45.9 12/08/2016   MCV 92.4 12/08/2016   PLT 217 12/08/2016   Lab Results  Component Value Date   NA 144 04/27/2022   K 4.5 04/27/2022   CL 105 04/27/2022   CO2 22 04/27/2022   No results found for: "ALT", "AST", "GGT", "ALKPHOS", "BILITOT"   RADIOGRAPHY: No results found.    IMPRESSION/PLAN: 1. 71 y.o. gentleman with Stage T1c adenocarcinoma of the prostate with Gleason score of 4+5, and PSA of 12. The patient has elected to proceed with LT-ADT concurrent with brachytherapy seed boost with SpaceOAR gel placement followed by a 5 week course of daily prostate IMRT for treatment of his disease.  His ADT was started on 05/31/2022.  We reviewed the risks, benefits, short and long-term effects associated with brachytherapy and discussed the role of SpaceOAR in reducing the rectal toxicity associated with radiotherapy.  He appears to have a good understanding of his disease and our treatment recommendations which are of curative intent.  He was encouraged to ask questions that were answered to his stated  satisfaction. He has freely signed written consent to proceed today in the office and a copy of this document will be placed in his medical record. His procedure is tentatively scheduled for 09/15/2022 in collaboration with Dr. Alyson Mills and we will see him back for his post-procedure visit approximately 3 weeks thereafter. We look forward to continuing to participate in his care. He knows that he is welcome to call with any questions or concerns at any time in the interim.  I personally spent 30 minutes in this encounter including chart review, reviewing radiological studies, meeting face-to-face with the patient, entering orders and completing documentation.    Nicholos Johns, MMS, PA-C [facility] [place]: [phone]  Fax: [phone]

## 2022-07-20 NOTE — Progress Notes (Signed)
  Radiation Oncology         [phone] ________________________________  Name: JI FELDNER MRN: [id number]  Date: 07/21/2022  DOB: 1951-02-15  SIMULATION AND TREATMENT PLANNING NOTE PUBIC ARCH STUDY  JJ:[id number], Weldon Picking, MD  Cleon Gustin, MD  DIAGNOSIS:   71 y.o. gentleman with Stage T1c adenocarcinoma of the prostate with Gleason score of 4+5, and PSA of 12.  Oncology History  Malignant neoplasm of prostate ([place])  04/20/2022 Cancer Staging   Staging form: Prostate, AJCC 8th Edition - Clinical stage from 04/20/2022: Stage IIIC (cT1c, cN0, cM0, PSA: 12, Grade Group: 5) - Signed by Freeman Caldron, PA-C on 05/27/2022 Histopathologic type: Adenocarcinoma, NOS Stage prefix: Initial diagnosis Prostate specific antigen (PSA) range: 10 to 19 Gleason primary pattern: 4 Gleason secondary pattern: 5 Gleason score: 9 Histologic grading system: 5 grade system Number of biopsy cores examined: 12 Number of biopsy cores positive: 11 Location of positive needle core biopsies: Both sides   05/27/2022 Initial Diagnosis   Malignant neoplasm of prostate ([place])       ICD-10-CM   1. Malignant neoplasm of prostate ([place])  C61       COMPLEX SIMULATION:  The patient presented today for evaluation for possible prostate seed implant. He was brought to the radiation planning suite and placed supine on the CT couch. A 3-dimensional image study set was obtained in upload to the planning computer. There, on each axial slice, I contoured the prostate gland. Then, using three-dimensional radiation planning tools I reconstructed the prostate in view of the structures from the transperineal needle pathway to assess for possible pubic arch interference. In doing so, I did not appreciate any pubic arch interference. Also, the patient's prostate volume was estimated based on the drawn structure. The volume was 23 cc.  Given the pubic arch appearance and prostate volume, patient remains a good candidate to  proceed with prostate seed implant. Today, he freely provided informed written consent to proceed.    PLAN: The patient will undergo prostate seed implant boost to be followed by IMRT.   ________________________________  Sheral Apley. Tammi Klippel, M.D.

## 2022-07-21 ENCOUNTER — Encounter (HOSPITAL_COMMUNITY)
Admission: RE | Admit: 2022-07-21 | Discharge: 2022-07-21 | Disposition: A | Discharge status: Home / Self Care | Payer: PPO | Source: Ambulatory Visit | Attending: Urology | Admitting: Urology

## 2022-07-21 ENCOUNTER — Ambulatory Visit
Admission: RE | Admit: 2022-07-21 | Discharge: 2022-07-21 | Disposition: A | Discharge status: Home / Self Care | Payer: PPO | Source: Ambulatory Visit | Attending: Radiation Oncology | Admitting: Radiation Oncology

## 2022-07-21 ENCOUNTER — Ambulatory Visit
Admission: RE | Admit: 2022-07-21 | Discharge: 2022-07-21 | Disposition: A | Discharge status: Home / Self Care | Payer: PPO | Source: Ambulatory Visit | Attending: Urology | Admitting: Urology

## 2022-07-21 ENCOUNTER — Other Ambulatory Visit: Payer: Self-pay

## 2022-07-21 ENCOUNTER — Encounter: Payer: Self-pay | Admitting: Urology

## 2022-07-21 VITALS — Resp 19 | Ht 67.0 in | Wt 178.0 lb

## 2022-07-21 DIAGNOSIS — Z01818 Encounter for other preprocedural examination: Secondary | ICD-10-CM | POA: Insufficient documentation

## 2022-07-21 DIAGNOSIS — C61 Malignant neoplasm of prostate: Secondary | ICD-10-CM | POA: Diagnosis not present

## 2022-07-21 DIAGNOSIS — Z191 Hormone sensitive malignancy status: Secondary | ICD-10-CM | POA: Diagnosis not present

## 2022-07-21 DIAGNOSIS — R001 Bradycardia, unspecified: Secondary | ICD-10-CM | POA: Insufficient documentation

## 2022-07-21 NOTE — Progress Notes (Signed)
Pre-seed appointment. I verified patient's identity and began nursing interview w/ spouse Mrs. Lanice Shirts in attendance. Patient reports hot flashes and nocturia x3. No other issues reported at this time.  Meaningful use complete. No urinary management medications.  Urology appt- Sep 15, 2022  Resp 19   Ht '5\' 7"'$  (1.702 m)   Wt 178 lb (80.7 kg)   BMI 27.88 kg/m

## 2022-07-21 NOTE — Addendum Note (Signed)
Encounter addended by: Freeman Caldron, PA-C on: 07/21/2022 10:44 AM  Actions taken: Level of Service modified

## 2022-07-21 NOTE — Addendum Note (Signed)
Encounter addended by: Freeman Caldron, PA-C on: 07/21/2022 10:36 AM  Actions taken: Order list changed, Diagnosis association updated

## 2022-07-25 ENCOUNTER — Telehealth: Payer: Self-pay | Admitting: *Deleted

## 2022-07-25 NOTE — Telephone Encounter (Signed)
Called patient to inform of [place] visit with Dr. Lynnette Caffey of [facility] on 08-30-22- arrival time- 12:15 pm, spoke with patient and he is aware of this appt.

## 2022-07-27 NOTE — Progress Notes (Signed)
Spoke with dr w Sabra Heck mda and made aware ekg results 07-21-2022 and 03-24-2016 ekg results from dr Manuella Ghazi, called and spoke with patient and patient has no cardiac history or current cardiac symptoms. Patient ekg ok for 09-15-2022 surgery per dr w Sabra Heck mda.

## 2022-07-29 DIAGNOSIS — I10 Essential (primary) hypertension: Secondary | ICD-10-CM | POA: Diagnosis not present

## 2022-08-01 DIAGNOSIS — L821 Other seborrheic keratosis: Secondary | ICD-10-CM | POA: Diagnosis not present

## 2022-08-01 DIAGNOSIS — L820 Inflamed seborrheic keratosis: Secondary | ICD-10-CM | POA: Diagnosis not present

## 2022-08-29 DIAGNOSIS — I10 Essential (primary) hypertension: Secondary | ICD-10-CM | POA: Diagnosis not present

## 2022-08-30 DIAGNOSIS — C61 Malignant neoplasm of prostate: Secondary | ICD-10-CM | POA: Diagnosis not present

## 2022-09-09 ENCOUNTER — Encounter (HOSPITAL_BASED_OUTPATIENT_CLINIC_OR_DEPARTMENT_OTHER): Payer: Self-pay | Admitting: Urology

## 2022-09-09 NOTE — Progress Notes (Signed)
Spoke w/ via phone for pre-op interview--- pt Lab needs dos----   I-stat            Lab results------ COVID test -----patient states asymptomatic no test needed Arrive at ------- 1130 NPO after MN NO Solid Food.  Clear liquids from MN until--- 1030 Med rec completed Medications to take morning of surgery ----- Amlodipine and rosuvastatin  Diabetic medication ----- Patient not taking any diabetic medication  Patient instructed no nail polish to be worn day of surgery Patient instructed to bring photo id and insurance card day of surgery Patient aware to have Driver (ride ) / caregiver    for 24 hours after surgery Spencer Mills Patient Special Instructions ----- Hold Lisinopril DOS. Patient stated that his primary care doctor told him that he could stop taking Aspirin. Patient stated that he had not had it in over a week.  Pre-Op special Istructions ----- Patient verbalized understanding of instructions that were given at this phone interview. Patient denies shortness of breath, chest pain, fever, cough at this phone interview.

## 2022-09-14 ENCOUNTER — Telehealth: Payer: Self-pay | Admitting: *Deleted

## 2022-09-14 NOTE — Telephone Encounter (Signed)
CALLED PATIENT TO REMIND OF PROCEDURE FOR 09-15-22, SPOKE WITH PATIENT AND HE IS AWARE

## 2022-09-15 ENCOUNTER — Ambulatory Visit (HOSPITAL_BASED_OUTPATIENT_CLINIC_OR_DEPARTMENT_OTHER): Payer: PPO | Admitting: Anesthesiology

## 2022-09-15 ENCOUNTER — Encounter (HOSPITAL_BASED_OUTPATIENT_CLINIC_OR_DEPARTMENT_OTHER)
Admission: RE | Disposition: A | Discharge status: Home / Self Care | Payer: Self-pay | Source: Ambulatory Visit | Attending: Urology

## 2022-09-15 ENCOUNTER — Ambulatory Visit (HOSPITAL_BASED_OUTPATIENT_CLINIC_OR_DEPARTMENT_OTHER)
Admission: RE | Admit: 2022-09-15 | Discharge: 2022-09-15 | Disposition: A | Discharge status: Home / Self Care | Payer: PPO | Source: Ambulatory Visit | Attending: Urology | Admitting: Urology

## 2022-09-15 ENCOUNTER — Encounter (HOSPITAL_BASED_OUTPATIENT_CLINIC_OR_DEPARTMENT_OTHER): Payer: Self-pay | Admitting: Urology

## 2022-09-15 ENCOUNTER — Ambulatory Visit (HOSPITAL_COMMUNITY): Payer: PPO

## 2022-09-15 DIAGNOSIS — C61 Malignant neoplasm of prostate: Secondary | ICD-10-CM | POA: Diagnosis not present

## 2022-09-15 DIAGNOSIS — Z191 Hormone sensitive malignancy status: Secondary | ICD-10-CM | POA: Diagnosis not present

## 2022-09-15 DIAGNOSIS — K759 Inflammatory liver disease, unspecified: Secondary | ICD-10-CM

## 2022-09-15 DIAGNOSIS — E119 Type 2 diabetes mellitus without complications: Secondary | ICD-10-CM | POA: Insufficient documentation

## 2022-09-15 DIAGNOSIS — F17210 Nicotine dependence, cigarettes, uncomplicated: Secondary | ICD-10-CM

## 2022-09-15 DIAGNOSIS — F17200 Nicotine dependence, unspecified, uncomplicated: Secondary | ICD-10-CM | POA: Diagnosis not present

## 2022-09-15 DIAGNOSIS — B182 Chronic viral hepatitis C: Secondary | ICD-10-CM | POA: Diagnosis not present

## 2022-09-15 DIAGNOSIS — Z01818 Encounter for other preprocedural examination: Secondary | ICD-10-CM

## 2022-09-15 HISTORY — PX: SPACE OAR INSTILLATION: SHX6769

## 2022-09-15 HISTORY — PX: CYSTOSCOPY: SHX5120

## 2022-09-15 HISTORY — DX: Unspecified osteoarthritis, unspecified site: M19.90

## 2022-09-15 HISTORY — PX: RADIOACTIVE SEED IMPLANT: SHX5150

## 2022-09-15 HISTORY — DX: Malignant (primary) neoplasm, unspecified: C80.1

## 2022-09-15 LAB — POCT I-STAT, CHEM 8
BUN: 15 mg/dL (ref 8–23)
Calcium, Ion: 1.27 mmol/L (ref 1.15–1.40)
Chloride: 108 mmol/L (ref 98–111)
Creatinine, Ser: 1.1 mg/dL (ref 0.61–1.24)
Glucose, Bld: 113 mg/dL — ABNORMAL HIGH (ref 70–99)
HCT: 40 % (ref 39.0–52.0)
Hemoglobin: 13.6 g/dL (ref 13.0–17.0)
Potassium: 4.6 mmol/L (ref 3.5–5.1)
Sodium: 141 mmol/L (ref 135–145)
TCO2: 22 mmol/L (ref 22–32)

## 2022-09-15 LAB — GLUCOSE, CAPILLARY: Glucose-Capillary: 101 mg/dL — ABNORMAL HIGH (ref 70–99)

## 2022-09-15 SURGERY — INSERTION, RADIATION SOURCE, PROSTATE
Anesthesia: General | Site: Prostate

## 2022-09-15 MED ORDER — DEXAMETHASONE SODIUM PHOSPHATE 10 MG/ML IJ SOLN
INTRAMUSCULAR | Status: DC | PRN
  Administered 2022-09-15: 10 mg via INTRAVENOUS

## 2022-09-15 MED ORDER — MIDAZOLAM HCL 2 MG/2ML IJ SOLN
INTRAMUSCULAR | Status: AC
  Filled 2022-09-15: qty 2

## 2022-09-15 MED ORDER — MIDAZOLAM HCL 2 MG/2ML IJ SOLN
INTRAMUSCULAR | Status: DC | PRN
  Administered 2022-09-15: 2 mg via INTRAVENOUS

## 2022-09-15 MED ORDER — PHENYLEPHRINE 80 MCG/ML (10ML) SYRINGE FOR IV PUSH (FOR BLOOD PRESSURE SUPPORT)
PREFILLED_SYRINGE | INTRAVENOUS | Status: AC
  Filled 2022-09-15: qty 10

## 2022-09-15 MED ORDER — PROPOFOL 10 MG/ML IV BOLUS
INTRAVENOUS | Status: DC | PRN
  Administered 2022-09-15: 160 mg via INTRAVENOUS
  Administered 2022-09-15 (×4): 20 mg via INTRAVENOUS

## 2022-09-15 MED ORDER — CEFAZOLIN SODIUM-DEXTROSE 2-4 GM/100ML-% IV SOLN
2.0000 g | Freq: Once | INTRAVENOUS | Status: AC
Start: 2022-09-15 — End: 2022-09-15
  Administered 2022-09-15: 2 g via INTRAVENOUS

## 2022-09-15 MED ORDER — ONDANSETRON HCL 4 MG/2ML IJ SOLN
INTRAMUSCULAR | Status: DC | PRN
  Administered 2022-09-15: 4 mg via INTRAVENOUS

## 2022-09-15 MED ORDER — ACETAMINOPHEN 10 MG/ML IV SOLN
INTRAVENOUS | Status: AC
  Filled 2022-09-15: qty 100

## 2022-09-15 MED ORDER — ROCURONIUM BROMIDE 10 MG/ML (PF) SYRINGE
PREFILLED_SYRINGE | INTRAVENOUS | Status: DC | PRN
  Administered 2022-09-15: 10 mg via INTRAVENOUS
  Administered 2022-09-15: 50 mg via INTRAVENOUS

## 2022-09-15 MED ORDER — ROCURONIUM BROMIDE 10 MG/ML (PF) SYRINGE
PREFILLED_SYRINGE | INTRAVENOUS | Status: AC
  Filled 2022-09-15: qty 10

## 2022-09-15 MED ORDER — IOHEXOL 300 MG/ML  SOLN
INTRAMUSCULAR | Status: DC | PRN
  Administered 2022-09-15: 7 mL

## 2022-09-15 MED ORDER — PHENYLEPHRINE 80 MCG/ML (10ML) SYRINGE FOR IV PUSH (FOR BLOOD PRESSURE SUPPORT)
PREFILLED_SYRINGE | INTRAVENOUS | Status: DC | PRN
  Administered 2022-09-15 (×3): 160 ug via INTRAVENOUS
  Administered 2022-09-15 (×2): 80 ug via INTRAVENOUS
  Administered 2022-09-15: 160 ug via INTRAVENOUS
  Administered 2022-09-15: 80 ug via INTRAVENOUS
  Administered 2022-09-15 (×2): 160 ug via INTRAVENOUS

## 2022-09-15 MED ORDER — EPHEDRINE 5 MG/ML INJ
INTRAVENOUS | Status: AC
  Filled 2022-09-15: qty 5

## 2022-09-15 MED ORDER — SODIUM CHLORIDE 0.9 % IV SOLN: INTRAVENOUS | Status: DC | PRN

## 2022-09-15 MED ORDER — FLEET ENEMA 7-19 GM/118ML RE ENEM: 1.0000 | ENEMA | Freq: Once | RECTAL | Status: DC

## 2022-09-15 MED ORDER — SUGAMMADEX SODIUM 200 MG/2ML IV SOLN
INTRAVENOUS | Status: DC | PRN
  Administered 2022-09-15: 350 mg via INTRAVENOUS

## 2022-09-15 MED ORDER — ACETAMINOPHEN 10 MG/ML IV SOLN
INTRAVENOUS | Status: DC | PRN
  Administered 2022-09-15: 1000 mg via INTRAVENOUS

## 2022-09-15 MED ORDER — FENTANYL CITRATE (PF) 100 MCG/2ML IJ SOLN
INTRAMUSCULAR | Status: DC | PRN
  Administered 2022-09-15 (×2): 50 ug via INTRAVENOUS

## 2022-09-15 MED ORDER — HYDROMORPHONE HCL 1 MG/ML IJ SOLN
0.2500 mg | INTRAMUSCULAR | Status: DC | PRN
  Administered 2022-09-15: 0.5 mg via INTRAVENOUS

## 2022-09-15 MED ORDER — EPHEDRINE SULFATE-NACL 50-0.9 MG/10ML-% IV SOSY
PREFILLED_SYRINGE | INTRAVENOUS | Status: DC | PRN
  Administered 2022-09-15: 15 mg via INTRAVENOUS
  Administered 2022-09-15 (×3): 5 mg via INTRAVENOUS

## 2022-09-15 MED ORDER — PROPOFOL 10 MG/ML IV BOLUS
INTRAVENOUS | Status: AC
  Filled 2022-09-15: qty 20

## 2022-09-15 MED ORDER — TRAMADOL HCL 50 MG PO TABS: 50.0000 mg | ORAL_TABLET | Freq: Once | ORAL | Status: DC

## 2022-09-15 MED ORDER — SODIUM CHLORIDE (PF) 0.9 % IJ SOLN
INTRAMUSCULAR | Status: DC | PRN
  Administered 2022-09-15: 10 mL

## 2022-09-15 MED ORDER — SODIUM CHLORIDE (PF) 0.9 % IJ SOLN
INTRAMUSCULAR | Status: DC | PRN
  Administered 2022-09-15: 3 mL

## 2022-09-15 MED ORDER — LACTATED RINGERS IV SOLN: INTRAVENOUS | Status: DC

## 2022-09-15 MED ORDER — HYDROMORPHONE HCL 1 MG/ML IJ SOLN
INTRAMUSCULAR | Status: AC
  Filled 2022-09-15: qty 1

## 2022-09-15 MED ORDER — FENTANYL CITRATE (PF) 100 MCG/2ML IJ SOLN
INTRAMUSCULAR | Status: AC
  Filled 2022-09-15: qty 2

## 2022-09-15 MED ORDER — DEXAMETHASONE SODIUM PHOSPHATE 10 MG/ML IJ SOLN
INTRAMUSCULAR | Status: AC
  Filled 2022-09-15: qty 1

## 2022-09-15 MED ORDER — TRAMADOL HCL 50 MG PO TABS: 50.0000 mg | ORAL_TABLET | Freq: Four times a day (QID) | ORAL | 0 refills | Status: AC | PRN

## 2022-09-15 MED ORDER — ONDANSETRON HCL 4 MG/2ML IJ SOLN
INTRAMUSCULAR | Status: AC
  Filled 2022-09-15: qty 2

## 2022-09-15 MED ORDER — TRAMADOL HCL 50 MG PO TABS
ORAL_TABLET | ORAL | Status: AC
  Filled 2022-09-15: qty 1

## 2022-09-15 MED ORDER — CEFAZOLIN SODIUM-DEXTROSE 2-4 GM/100ML-% IV SOLN
INTRAVENOUS | Status: AC
  Filled 2022-09-15: qty 100

## 2022-09-15 MED ORDER — SODIUM CHLORIDE 0.9 % IR SOLN
Status: DC | PRN
  Administered 2022-09-15: 100 mL

## 2022-09-15 MED ORDER — LIDOCAINE 2% (20 MG/ML) 5 ML SYRINGE
INTRAMUSCULAR | Status: DC | PRN
  Administered 2022-09-15: 100 mg via INTRAVENOUS

## 2022-09-15 SURGICAL SUPPLY — 48 items
BAG DRN RND TRDRP ANRFLXCHMBR (UROLOGICAL SUPPLIES) ×2
BAG URINE DRAIN 2000ML AR STRL (UROLOGICAL SUPPLIES) ×2 IMPLANT
BLADE CLIPPER SENSICLIP SURGIC (BLADE) ×2 IMPLANT
CATH FOLEY 2WAY SLVR  5CC 16FR (CATHETERS) ×4
CATH FOLEY 2WAY SLVR 5CC 16FR (CATHETERS) ×4 IMPLANT
CATH ROBINSON RED A/P 20FR (CATHETERS) ×2 IMPLANT
CLOTH BEACON ORANGE TIMEOUT ST (SAFETY) ×2 IMPLANT
CNTNR URN SCR LID CUP LEK RST (MISCELLANEOUS) ×2 IMPLANT
CONT SPEC 4OZ STRL OR WHT (MISCELLANEOUS) ×2
COVER BACK TABLE 60X90IN (DRAPES) ×2 IMPLANT
COVER MAYO STAND STRL (DRAPES) ×2 IMPLANT
DRAPE 3/4 80X56 (DRAPES) IMPLANT
DRAPE C-ARM 42X120 X-RAY (DRAPES) IMPLANT
DRSG TEGADERM 4X4.75 (GAUZE/BANDAGES/DRESSINGS) ×2 IMPLANT
DRSG TEGADERM 8X12 (GAUZE/BANDAGES/DRESSINGS) ×2 IMPLANT
GAUZE SPONGE 4X4 12PLY STRL LF (GAUZE/BANDAGES/DRESSINGS) IMPLANT
GEL ULTRASOUND 20GR AQUASONIC (MISCELLANEOUS) ×4 IMPLANT
GLOVE BIO SURGEON STRL SZ 6.5 (GLOVE) ×2 IMPLANT
GLOVE BIO SURGEON STRL SZ7.5 (GLOVE) IMPLANT
GLOVE BIO SURGEON STRL SZ8 (GLOVE) ×2 IMPLANT
GLOVE BIOGEL PI IND STRL 6.5 (GLOVE) IMPLANT
GLOVE SURG ORTHO 8.5 STRL (GLOVE) ×2 IMPLANT
GLOVE SURG SS PI 6.5 STRL IVOR (GLOVE) IMPLANT
GOWN STRL REUS W/TWL XL LVL3 (GOWN DISPOSABLE) ×2 IMPLANT
GRID BRACH TEMP 18GA 2.8X3X.75 (MISCELLANEOUS) ×2 IMPLANT
HOLDER FOLEY CATH W/STRAP (MISCELLANEOUS) ×2 IMPLANT
IMPL SPACEOAR VUE SYSTEM (Spacer) ×2 IMPLANT
IMPLANT SPACEOAR VUE SYSTEM (Spacer) ×2 IMPLANT
IV NS 1000ML (IV SOLUTION) ×4
IV NS 1000ML BAXH (IV SOLUTION) ×2 IMPLANT
KIT TURNOVER CYSTO (KITS) ×2 IMPLANT
MANIFOLD NEPTUNE II (INSTRUMENTS) IMPLANT
NDL BRACHY 18G 5PK (NEEDLE) ×8 IMPLANT
NDL BRACHY 18G SINGLE (NEEDLE) IMPLANT
NDL PK MORGANSTERN STABILIZ (NEEDLE) ×2 IMPLANT
NEEDLE BRACHY 18G 5PK (NEEDLE) ×8 IMPLANT
NEEDLE BRACHY 18G SINGLE (NEEDLE) ×2 IMPLANT
NEEDLE PK MORGANSTERN STABILIZ (NEEDLE) ×2 IMPLANT
PACK CYSTO (CUSTOM PROCEDURE TRAY) ×2 IMPLANT
QuickLink Delivery System IMPLANT
SHEATH ULTRASOUND LF (SHEATH) IMPLANT
SHEATH ULTRASOUND LTX NONSTRL (SHEATH) IMPLANT
SYR 10ML LL (SYRINGE) ×4 IMPLANT
SYR CONTROL 10ML LL (SYRINGE) ×2 IMPLANT
TOWEL OR 17X26 10 PK STRL BLUE (TOWEL DISPOSABLE) ×4 IMPLANT
UNDERPAD 30X36 HEAVY ABSORB (UNDERPADS AND DIAPERS) ×4 IMPLANT
WATER STERILE IRR 3000ML UROMA (IV SOLUTION) ×2 IMPLANT
WATER STERILE IRR 500ML POUR (IV SOLUTION) ×2 IMPLANT

## 2022-09-15 NOTE — Anesthesia Procedure Notes (Signed)
Procedure Name: Intubation Date/Time: 09/15/2022 1:47 PM  Performed by: Mechele Claude, CRNAPre-anesthesia Checklist: Patient identified, Emergency Drugs available, Suction available and Patient being monitored Patient Re-evaluated:Patient Re-evaluated prior to induction Oxygen Delivery Method: Circle system utilized Preoxygenation: Pre-oxygenation with 100% oxygen Induction Type: IV induction Ventilation: Mask ventilation without difficulty Laryngoscope Size: Mac and 4 Grade View: Grade I Tube type: Oral Tube size: 7.5 mm Number of attempts: 1 Airway Equipment and Method: Stylet and Oral airway Placement Confirmation: ETT inserted through vocal cords under direct vision, positive ETCO2 and breath sounds checked- equal and bilateral Secured at: 23 cm Tube secured with: Tape Dental Injury: Teeth and Oropharynx as per pre-operative assessment

## 2022-09-15 NOTE — Anesthesia Postprocedure Evaluation (Signed)
Anesthesia Post Note  Patient: Spencer Mills  Procedure(s) Performed: RADIOACTIVE SEED IMPLANT/BRACHYTHERAPY IMPLANT (Prostate) SPACE OAR INSTILLATION (Prostate) FLEXIBLE CYSTOSCOPY (Bladder)     Patient location during evaluation: PACU Anesthesia Type: General Level of consciousness: awake Pain management: pain level controlled Vital Signs Assessment: post-procedure vital signs reviewed and stable Respiratory status: spontaneous breathing Cardiovascular status: stable Postop Assessment: no apparent nausea or vomiting Anesthetic complications: no   No notable events documented.  Last Vitals:  Vitals:   09/15/22 1545 09/15/22 1600  BP: (!) 113/57 (!) 140/75  Pulse: 67 66  Resp: 19 15  Temp:    SpO2: 93% 92%    Last Pain:  Vitals:   09/15/22 1157  TempSrc: Oral  PainSc: 0-No pain                 Latysha Thackston

## 2022-09-15 NOTE — Op Note (Signed)
PRE-OPERATIVE DIAGNOSIS:  Adenocarcinoma of the prostate  POST-OPERATIVE DIAGNOSIS:  Same  PROCEDURE:  Procedure(s): 1. I-125 radioactive seed implantation 2. Cystoscopy 3. SpaceOAR placement  SURGEON:  Surgeon(s): Nicolette Bang, MD  Radiation oncologist: Tyler Pita, MD  ANESTHESIA:  General  EBL:  Minimal  DRAINS: 30 French Foley catheter  INDICATION: Spencer Mills is a 71 year old with a history of T1c prostate cancer. After discussing treatment options he has elected to proceed with brachytherapy  Description of procedure: After informed consent the patient was brought to the major OR, placed on the table and administered general anesthesia. He was then moved to the modified lithotomy position with his perineum perpendicular to the floor. His perineum and genitalia were then sterilely prepped. An official timeout was then performed. A 16 French Foley catheter was then placed in the bladder and filled with dilute contrast, a rectal tube was placed in the rectum and the transrectal ultrasound probe was placed in the rectum and affixed to the stand. He was then sterilely draped.  Real time ultrasonography was used along with the seed planning software [employer] Prostate vs. 4.2.21. This was used to develop the seed plan including the number of needles as well as number of seeds required for complete and adequate coverage. Real-time ultrasonography was then used along with the previously developed plan and the Nucletron device to implant a total of 51 seeds using 18 needles. This proceeded without difficulty or complication.  We then proceeded to mix the SpaceOAR using the kit supplied from the manufacturer. Once this was complete we placed a sinal needle into the perirectal fat between the rectum and the prostate. Once this was accomplished we injected 2cc of normal saline to hydrodissect the plain. We then instilled the the SpaceOAR through the spinal needle and noted good  distribution in the perirectal fat.    A Foley catheter was then removed as well as the transrectal ultrasound probe and rectal probe. Flexible cystoscopy was then performed using the 17 French flexible scope which revealed a normal urethra throughout its length down to the sphincter which appeared intact. The prostatic urethra revealed bilobar hypertrophy but no evidence of obstruction, seeds, spacers or lesions. The bladder was then entered and fully and systematically inspected. The ureteral orifices were noted to be of normal configuration and position. The mucosa revealed no evidence of tumors. There were also no stones identified within the bladder. I noted no seeds or spacers on the floor of the bladder and retroflexion of the scope revealed no seeds protruding from the base of the prostate.  The cystoscope was then removed and a new 33 French Foley catheter was then inserted and the balloon was filled with 10 cc of sterile water. This was connected to closed system drainage and the patient was awakened and taken to recovery room in stable and satisfactory condition. He tolerated procedure well and there were no intraoperative complications.

## 2022-09-15 NOTE — Progress Notes (Signed)
Radiation Oncology         [phone] ________________________________  Name: IDRISS QUACKENBUSH MRN: [id number]  Date: 09/15/2022  DOB: November 26, 1950       Prostate Seed Implant  [id number], Weldon Picking, MD  No ref. provider found  DIAGNOSIS:   71 y.o. gentleman with Stage T1c adenocarcinoma of the prostate with Gleason score of 4+5, and PSA of 12.  Oncology History  Malignant neoplasm of prostate ([place])  04/20/2022 Cancer Staging   Staging form: Prostate, AJCC 8th Edition - Clinical stage from 04/20/2022: Stage IIIC (cT1c, cN0, cM0, PSA: 12, Grade Group: 5) - Signed by Freeman Caldron, PA-C on 05/27/2022 Histopathologic type: Adenocarcinoma, NOS Stage prefix: Initial diagnosis Prostate specific antigen (PSA) range: 10 to 19 Gleason primary pattern: 4 Gleason secondary pattern: 5 Gleason score: 9 Histologic grading system: 5 grade system Number of biopsy cores examined: 12 Number of biopsy cores positive: 11 Location of positive needle core biopsies: Both sides   05/27/2022 Initial Diagnosis   Malignant neoplasm of prostate ([place])       ICD-10-CM   1. Pre-op testing  Z01.818 CBG per Guidelines for Diabetes Management for Patients Undergoing Surgery ([facility], [facility], and [facility] only)    CBG per protocol    I-Stat, Chem 8 on day of surgery per protocol    CANCELED: EKG 12 lead per protocol      PROCEDURE: Insertion of radioactive I-125 seeds into the prostate gland.  RADIATION DOSE: 110 Gy, boost therapy.  TECHNIQUE: GARLON TUGGLE was brought to the operating room with the urologist. He was placed in the dorsolithotomy position. He was catheterized and a rectal tube was inserted. The perineum was shaved, prepped and draped. The ultrasound probe was then introduced by me into the rectum to see the prostate gland.  TREATMENT DEVICE: I attached the needle grid to the ultrasound probe stand and anchor needles were placed.  3D PLANNING: The prostate was imaged in 3D using a sagittal sweep of the  prostate probe. These images were transferred to the planning computer. There, the prostate, urethra and rectum were defined on each axial reconstructed image. Then, the software created an optimized 3D plan and a few seed positions were adjusted. The quality of the plan was reviewed using [facility] information for the target and the following two organs at risk:  Urethra and Rectum.  Then the accepted plan was printed and handed off to the radiation therapist.  Under my supervision, the custom loading of the seeds and spacers was carried out using the quick loader.  These pre-loaded needles were then placed into the needle holder.Marland Kitchen  PROSTATE VOLUME STUDY:  Using transrectal ultrasound the volume of the prostate was verified to be 16 cc.  SPECIAL TREATMENT PROCEDURE/SUPERVISION AND HANDLING: The pre-loaded needles were then delivered by the urologist under sagittal guidance. A total of 18 needles were used to deposit 51 seeds in the prostate gland. The individual seed activity was 0.245 mCi.  SpaceOAR:  Yes  COMPLEX SIMULATION: At the end of the procedure, an anterior radiograph of the pelvis was obtained to document seed positioning and count. Cystoscopy was performed by the urologist to check the urethra and bladder.  MICRODOSIMETRY: At the end of the procedure, the patient was emitting 0.03 mR/hr at 1 meter. Accordingly, he was considered safe for hospital discharge.  PLAN: The patient will return to the radiation oncology clinic for post implant CT dosimetry in three weeks.   ________________________________  Sheral Apley Tammi Klippel, M.D.

## 2022-09-15 NOTE — Discharge Instructions (Signed)
No acetaminophen/Tylenol until after 8:00 pm today if needed.    Radioactive Seed Implant Home Care Instructions   Activity:    Rest for the remainder of the day.  Do not drive or operate equipment today.  You may resume normal  activities in a few days as instructed by your physician, without risk of harmful radiation exposure to those around you, provided you follow the time and distance precautions on the Radiation Oncology Instruction Sheet.   Meals: Drink plenty of lipuids and eat light foods, such as gelatin or soup this evening .  You may return to normal meal plan tomorrow.  Return To Work: You may return to work as instructed by Naval architect.  Special Instruction:   If any seeds are found, use tweezers to pick up seeds and place in a glass container of any kind and bring to your physician's office.  Call your physician if any of these symptoms occur:  Persistent or heavy bleeding Urine stream diminishes or stops completely after catheter is removed Fever equal to or greater than 101 degrees F Cloudy urine with a strong foul odor Severe pain  You may feel some burning pain and/or hesitancy when you urinate after the catheter is removed.  These symptoms may increase over the next few weeks, but should diminish within forur to six weeks.  Applying moist heat to the lower abdomen or a hot tub bath may help relieve the pain.  If the discomfort becomes severe, please call your physician for additional medications.  Post Anesthesia Home Care Instructions  Activity: Get plenty of rest for the remainder of the day. A responsible individual must stay with you for 24 hours following the procedure.  For the next 24 hours, DO NOT: -Drive a car -Paediatric nurse -Drink alcoholic beverages -Take any medication unless instructed by your physician -Make any legal decisions or sign important papers.  Meals: Start with liquid foods such as gelatin or soup. Progress to regular foods  as tolerated. Avoid greasy, spicy, heavy foods. If nausea and/or vomiting occur, drink only clear liquids until the nausea and/or vomiting subsides. Call your physician if vomiting continues.  Special Instructions/Symptoms: Your throat may feel dry or sore from the anesthesia or the breathing tube placed in your throat during surgery. If this causes discomfort, gargle with warm salt water. The discomfort should disappear within 24 hours.

## 2022-09-15 NOTE — H&P (Signed)
Urology Admission H&P  Chief Complaint: prostate cancer  History of Present Illness: [name] is a 71yo here for brachytherapy with SpaceOAR for prostate cancer. He has hot flashes which do not bother him. He denies any significant LUTS. No other complaints today  Past Medical History:  Diagnosis Date   Arthritis    Cancer ([place])    Prostate cancer   Diabetes mellitus without complication ([place])    Hep C w/o coma, chronic ([place]) 08/2016   Past Surgical History:  Procedure Laterality Date   NO PAST SURGERIES      Home Medications:  Current Facility-Administered Medications  Medication Dose Route Frequency Provider Last Rate Last Admin   ceFAZolin (ANCEF) IVPB 2g/100 mL premix  2 g Intravenous Once Dariane Natzke, Candee Furbish, MD       iohexol (OMNIPAQUE) 300 MG/ML solution    PRN Cleon Gustin, MD   7 mL at 09/15/22 1305   lactated ringers infusion   Intravenous Continuous Ellender, Karyl Kinnier, MD 50 mL/hr at 09/15/22 1250 Continued from Pre-op at 09/15/22 1250   sodium chloride (PF) 0.9 % injection    PRN Cleon Gustin, MD   3 mL at 09/15/22 1306   [START ON 09/16/2022] sodium phosphate (FLEET) 7-19 GM/118ML enema 1 enema  1 enema Rectal Once Druanne Bosques, Candee Furbish, MD       Allergies: No Known Allergies  Family History  Family history unknown: Yes   Social History:  reports that he has been smoking cigarettes. He started smoking about 56 years ago. He has been smoking an average of 1 pack per day. He has never used smokeless tobacco. He reports current alcohol use. He reports that he does not currently use drugs.  Review of Systems  All other systems reviewed and are negative.   Physical Exam:  Vital signs in last 24 hours: Temp:  [97.9 F (36.6 C)] 97.9 F (36.6 C) (11/16 1157) Pulse Rate:  [55] 55 (11/16 1157) Resp:  [17] 17 (11/16 1157) BP: (116)/(60) 116/60 (11/16 1157) SpO2:  [100 %] 100 % (11/16 1157) Weight:  [80.7 kg] 80.7 kg (11/16 1157) Physical Exam Vitals  reviewed.  Constitutional:      Appearance: Normal appearance.  HENT:     Head: Normocephalic and atraumatic.     Nose: Nose normal.     Mouth/Throat:     Mouth: Mucous membranes are dry.  Eyes:     Extraocular Movements: Extraocular movements intact.     Pupils: Pupils are equal, round, and reactive to light.  Cardiovascular:     Rate and Rhythm: Normal rate and regular rhythm.  Pulmonary:     Effort: Pulmonary effort is normal. No respiratory distress.  Abdominal:     General: Abdomen is flat. There is no distension.  Musculoskeletal:        General: No swelling. Normal range of motion.     Cervical back: Normal range of motion and neck supple.  Skin:    General: Skin is warm and dry.  Neurological:     General: No focal deficit present.     Mental Status: He is alert and oriented to person, place, and time.  Psychiatric:        Mood and Affect: Mood normal.        Behavior: Behavior normal.        Thought Content: Thought content normal.        Judgment: Judgment normal.     Laboratory Data:  Results for orders  placed or performed during the hospital encounter of 09/15/22 (from the past 24 hour(s))  I-STAT, chem 8     Status: Abnormal   Collection Time: 09/15/22 12:10 PM  Result Value Ref Range   Sodium 141 135 - 145 mmol/L   Potassium 4.6 3.5 - 5.1 mmol/L   Chloride 108 98 - 111 mmol/L   BUN 15 8 - 23 mg/dL   Creatinine, Ser 1.10 0.61 - 1.24 mg/dL   Glucose, Bld 113 (H) 70 - 99 mg/dL   Calcium, Ion 1.27 1.15 - 1.40 mmol/L   TCO2 22 22 - 32 mmol/L   Hemoglobin 13.6 13.0 - 17.0 g/dL   HCT 40.0 39.0 - 52.0 %   No results found for this or any previous visit (from the past 240 hour(s)). Creatinine: Recent Labs    09/15/22 1210  CREATININE 1.10   Baseline Creatinine: 1.1  Impression/Assessment:  71yo with prostate cancer  Plan:  I discussed the natural history of prostate cancer with the patient and the various treatment options including active  surveillance, RALP, IMRT, brachytherapy, cryotherapy, HIFU and ADT. After discussing the options the patient elects for brachytherapy with SpaceOAR. Risks/benefits/alternatives discussed   Nicolette Bang 09/15/2022, 1:28 PM

## 2022-09-15 NOTE — Anesthesia Preprocedure Evaluation (Addendum)
Anesthesia Evaluation  Patient identified by MRN, date of birth, ID band Patient awake    Reviewed: Allergy & Precautions, NPO status , Patient's Chart, lab work & pertinent test results  Airway Mallampati: II       Dental   Pulmonary Current Smoker and Patient abstained from smoking.   breath sounds clear to auscultation       Cardiovascular negative cardio ROS  Rhythm:Regular Rate:Normal     Neuro/Psych    GI/Hepatic negative GI ROS,,,(+) Hepatitis -  Endo/Other  diabetes    Renal/GU negative Renal ROS     Musculoskeletal   Abdominal   Peds  Hematology   Anesthesia Other Findings   Reproductive/Obstetrics                             Anesthesia Physical Anesthesia Plan  ASA: 3  Anesthesia Plan: General   Post-op Pain Management: Tylenol PO (pre-op)*   Induction: Intravenous  PONV Risk Score and Plan: 2 and Ondansetron, Dexamethasone and Midazolam  Airway Management Planned: Oral ETT  Additional Equipment:   Intra-op Plan:   Post-operative Plan: Extubation in OR  Informed Consent: I have reviewed the patients History and Physical, chart, labs and discussed the procedure including the risks, benefits and alternatives for the proposed anesthesia with the patient or authorized representative who has indicated his/her understanding and acceptance.     Dental advisory given  Plan Discussed with: CRNA and Anesthesiologist  Anesthesia Plan Comments:        Anesthesia Quick Evaluation

## 2022-09-15 NOTE — OR Nursing (Signed)
16 Fr. Foley Catheter removed in [facility] OR #1 by C. Eeva Schlosser, RN per order. Do prior documentation of insertion by RN who inserted catheter. Rectal tube also removed in [facility] OR #1 per order, also not documented by RN who inserted.

## 2022-09-15 NOTE — Transfer of Care (Signed)
Immediate Anesthesia Transfer of Care Note  Patient: NAKEEM MURNANE  Procedure(s) Performed: Procedure(s) (LRB): RADIOACTIVE SEED IMPLANT/BRACHYTHERAPY IMPLANT (N/A) SPACE OAR INSTILLATION (N/A) FLEXIBLE CYSTOSCOPY (N/A)  Patient Location: PACU  Anesthesia Type: General  Level of Consciousness: awake, alert  and oriented  Airway & Oxygen Therapy: Patient Spontanous Breathing   Post-op Assessment: Report given to PACU RN and Post -op Vital signs reviewed and stable  Post vital signs: Reviewed and stable  Complications: No apparent anesthesia complications  Last Vitals:  Vitals Value Taken Time  BP 136/70 09/15/22 1531  Temp    Pulse 69 09/15/22 1534  Resp 12 09/15/22 1534  SpO2 92 % 09/15/22 1534  Vitals shown include unvalidated device data.  Last Pain:  Vitals:   09/15/22 1157  TempSrc: Oral  PainSc: 0-No pain      Patients Stated Pain Goal: 5 (05/69/79 4801)  Complications: No notable events documented.

## 2022-09-16 ENCOUNTER — Encounter (HOSPITAL_BASED_OUTPATIENT_CLINIC_OR_DEPARTMENT_OTHER): Payer: Self-pay | Admitting: Urology

## 2022-09-20 ENCOUNTER — Telehealth: Payer: Self-pay | Admitting: *Deleted

## 2022-09-20 NOTE — Telephone Encounter (Signed)
Called patient to inform that sim appt. has been moved to 09-30-22- arrival time- 12:45 pm @ [facility], spoke with patient and he is aware of this appt. change and is good with it

## 2022-09-21 ENCOUNTER — Ambulatory Visit (INDEPENDENT_AMBULATORY_CARE_PROVIDER_SITE_OTHER): Payer: PPO | Admitting: Physician Assistant

## 2022-09-21 VITALS — BP 84/63 | HR 71

## 2022-09-21 DIAGNOSIS — C61 Malignant neoplasm of prostate: Secondary | ICD-10-CM | POA: Diagnosis not present

## 2022-09-21 LAB — BLADDER SCAN AMB NON-IMAGING: Scan Result: 68

## 2022-09-21 MED ORDER — TAMSULOSIN HCL 0.4 MG PO CAPS: 0.4000 mg | ORAL_CAPSULE | Freq: Every day | ORAL | 0 refills | Status: DC

## 2022-09-21 NOTE — Progress Notes (Signed)
Assessment: 1. Prostate cancer [facility])    Plan: Patient return for PVR after voiding trial and has had 2 BMs but no intentional urine voiding since leaving the office after voiding trial.  He feels no urge to void at this time and has no discomfort.  PVR = 68 mL The patient wants to leave out the Foley and understands that he may have to go to the emergency department after the long holiday weekend if he is unable to void.  If he has issues voiding or develops pain and urgency, he will go to the emergency department.  Prescription for Flomax sent to the pharmacy if has to have the Foley replaced while the office is closed. Pt will keep appt for CT dosimetry scheduled with Dr. Tammi Klippel Follow-up in 3 months for PSA followed by office visit a few days later Chief Complaint: No chief complaint on file.   HPI: Spencer Mills is a 71 y.o. male with stage T1c adenocarcinoma of the prostate who presents for voiding trial and post op evaluation of seed implantation with SpaceOAR placement performed on 09/15/2022.  Patient has done well since the procedure and is scheduled for follow-up CT dosimetry with Dr. Tammi Klippel.  His primary complaint is burning and tingling of the distal penis.  No gross hematuria.  Overall, he states he is doing well.  Patient admits to very little p.o. fluid intake during the day.  He drinks mostly tea and coffee on occasion.  Advised to increase water intake.  The patient was unable to void the minutes of catheter removal.  He will leave and return mid afternoon for PVR and possible reinsertion of the Foley.  Portions of the above documentation were copied from a prior visit for review purposes only.  Allergies: No Known Allergies  PMH: Past Medical History:  Diagnosis Date   Arthritis    Cancer ([place])    Prostate cancer   Diabetes mellitus without complication ([place])    Hep C w/o coma, chronic ([place]) 08/2016    PSH: Past Surgical History:  Procedure Laterality  Date   CYSTOSCOPY N/A 09/15/2022   Procedure: FLEXIBLE CYSTOSCOPY;  Surgeon: Cleon Gustin, MD;  Location: [facility];  Service: Urology;  Laterality: N/A;  No seeds detected in bladder per Dr. Alyson Ingles   NO PAST SURGERIES     RADIOACTIVE SEED IMPLANT N/A 09/15/2022   Procedure: RADIOACTIVE SEED IMPLANT/BRACHYTHERAPY IMPLANT;  Surgeon: Cleon Gustin, MD;  Location: [facility];  Service: Urology;  Laterality: N/A;   SPACE OAR INSTILLATION N/A 09/15/2022   Procedure: SPACE OAR INSTILLATION;  Surgeon: Cleon Gustin, MD;  Location: [facility];  Service: Urology;  Laterality: N/A;    SH: Social History   Tobacco Use   Smoking status: Every Day    Packs/day: 1.00    Types: Cigarettes    Start date: 04/29/1966   Smokeless tobacco: Never  Substance Use Topics   Alcohol use: Yes    Comment: Ocassionally   Drug use: Not Currently    ROS: All other review of systems were reviewed and are negative except what is noted above in HPI  PE: BP (!) 84/63   Pulse 71  GENERAL APPEARANCE:  Well appearing, well developed, NAD HEENT:  Atraumatic, normocephalic NECK:  Supple. Trachea midline ABDOMEN:  Soft, non-tender, no masses, no CVAT EXTREMITIES:  Moves all extremities well NEUROLOGIC:  Alert and oriented x 3 MENTAL STATUS:  appropriate SKIN:  Warm, dry, and  intact   Results: Laboratory Data: Lab Results  Component Value Date   WBC 8.8 12/08/2016   HGB 13.6 09/15/2022   HCT 40.0 09/15/2022   MCV 92.4 12/08/2016   PLT 217 12/08/2016    Lab Results  Component Value Date   CREATININE 1.10 09/15/2022    No results found for: "PSA"  Lab Results  Component Value Date   TESTOSTERONE <3 (L) 07/05/2022    No results found for: "HGBA1C"  Urinalysis    Component Value Date/Time   APPEARANCEUR Clear 07/12/2022 1028   GLUCOSEU Negative 07/12/2022 1028   BILIRUBINUR Negative 07/12/2022 1028   PROTEINUR 3+ (A)  07/12/2022 1028   NITRITE Negative 07/12/2022 1028   LEUKOCYTESUR Negative 07/12/2022 1028    Lab Results  Component Value Date   LABMICR See below: 07/12/2022   WBCUA None seen 07/12/2022   LABEPIT 0-10 07/12/2022   MUCUS Present 07/12/2022   BACTERIA Few 07/12/2022    Pertinent Imaging: No results found for this or any previous visit.  No results found for this or any previous visit.  No results found for this or any previous visit.  No results found for this or any previous visit.  No results found for this or any previous visit.  No valid procedures specified. No results found for this or any previous visit.  No results found for this or any previous visit.  No results found for this or any previous visit (from the past 24 hour(s)).

## 2022-09-21 NOTE — Progress Notes (Signed)
Fill and Pull Catheter Removal  Patient is present today for a catheter removal.  Patient was cleaned and prepped in a sterile fashion 15m of sterile water/ saline was instilled into the bladder when the patient felt the urge to urinate. 158mof water was then drained from the balloon.  A 16FR foley cath was removed from the bladder no complications were noted .  Patient as then given some time to void on their own.  Patient can void  15 ml on their own after some time.  Patient tolerated well.  Performed by: ShMarisue BrooklynCMA  Follow up/ Additional notes: Follow up as scheduled  Pt here today for bladder scan. Bladder was scanned and 5 was visualized.   Pt will return in 2pm for recheck PVR  Performed by ShRamapo Ridge Psychiatric HospitalCMA  Additional follow up @ 2pm 09/21/22  post void residual =68

## 2022-09-28 ENCOUNTER — Telehealth: Payer: Self-pay | Admitting: *Deleted

## 2022-09-28 DIAGNOSIS — E119 Type 2 diabetes mellitus without complications: Secondary | ICD-10-CM | POA: Diagnosis not present

## 2022-09-28 DIAGNOSIS — I10 Essential (primary) hypertension: Secondary | ICD-10-CM | POA: Diagnosis not present

## 2022-09-28 DIAGNOSIS — H2513 Age-related nuclear cataract, bilateral: Secondary | ICD-10-CM | POA: Diagnosis not present

## 2022-09-28 NOTE — Telephone Encounter (Signed)
CALLED PATIENT TO REMIND OF SIM APPT. FOR 09-30-22- ARRIVAL TIME- 12:45 PM, SPOKE WITH PATIENT AND HE IS AWARE OF THIS APPT.

## 2022-09-29 ENCOUNTER — Ambulatory Visit: Payer: PPO | Admitting: Radiation Oncology

## 2022-09-30 ENCOUNTER — Ambulatory Visit: Payer: PPO | Admitting: Radiation Oncology

## 2022-09-30 ENCOUNTER — Telehealth: Payer: Self-pay | Admitting: *Deleted

## 2022-09-30 ENCOUNTER — Ambulatory Visit: Payer: Self-pay | Admitting: Urology

## 2022-09-30 NOTE — Telephone Encounter (Signed)
Called patient to inform of coming for post seed appts. Instead of sim, spoke with patient and he is aware of this and is good with this

## 2022-09-30 NOTE — Telephone Encounter (Signed)
CALLED PATIENT TO INFORM NOT TO COME TODAY, I TOLD HIM THAT I WILL CALL HIM LATER TODAY TO RESCHEDULE, PATIENT VERIFIED UNDERSTANDING THIS

## 2022-09-30 NOTE — Progress Notes (Incomplete)
  Radiation Oncology         [phone] ________________________________  Name: Spencer Mills MRN: [id number]  Date: 09/30/2022  DOB: 1950-12-24  COMPLEX SIMULATION NOTE  NARRATIVE:  The patient was brought to the [facility] today following prostate seed implantation approximately one month ago.  Identity was confirmed.  All relevant records and images related to the planned course of therapy were reviewed.  Then, the patient was set-up supine.  CT images were obtained.  The CT images were loaded into the planning software.  Then the prostate and rectum were contoured.  Treatment planning then occurred.  The implanted iodine 125 seeds were identified by the physics staff for projection of radiation distribution  I have requested : 3D Simulation  I have requested a DVH of the following structures: Prostate and rectum.    ________________________________  Sheral Apley Tammi Klippel, M.D.

## 2022-10-04 ENCOUNTER — Telehealth: Payer: Self-pay | Admitting: *Deleted

## 2022-10-04 DIAGNOSIS — C61 Malignant neoplasm of prostate: Secondary | ICD-10-CM | POA: Diagnosis not present

## 2022-10-04 NOTE — Telephone Encounter (Signed)
CALLED PATIENT TO INFORM OF POST SEED APPTS. FOR 10-07-22, SPOKE WITH PATIENT AND HE IS AWARE OF THESE APPTS. AND IS GOOD WITH THESE APPTS.

## 2022-10-06 NOTE — Progress Notes (Signed)
Radiation Oncology         [phone] ________________________________  Name: Spencer Mills MRN: [id number]  Date: 10/07/2022  DOB: [birth year]  Post-Seed Follow-Up Visit Note  CC: Monico Blitz, MD  Cleon Gustin, MD  Diagnosis:    71 y.o. gentleman with Stage T1c adenocarcinoma of the prostate with Gleason score of 4+5, and PSA of 12.     ICD-10-CM   1. Malignant neoplasm of prostate ([place])  C61       Interval Since Last Radiation:  3 weeks 09/15/22:  Insertion of radioactive I-125 seeds into the prostate gland;110 Gy, boost therapy with placement of SpaceOAR gel.  Narrative:  The patient returns today for routine follow-up.  He is complaining of increased urinary frequency and urinary hesitation symptoms. He filled out a questionnaire regarding urinary function today providing and overall IPSS score of 8 characterizing his symptoms as mild-moderate with increased frequency and urgency. He specifically denies gross hematuria, dysuria, straining to void or incontinence.  His pre-implant score was 3. He has continued taking the Flomax daily as prescribed. He denies any abdominal pain or bowel symptoms. He met with Dr. Lynnette Caffey on 08/30/22 and is tentatively scheduled to start his daily external beam radiation to the prostate and pelvic nodes on 10/17/22 in [place]. Overall, he is pleased with his progress to date and eager to move forward with the external beam radiation and complete his treatment.  ALLERGIES:  has No Known Allergies.  Meds: Current Outpatient Medications  Medication Sig Dispense Refill   amLODipine (NORVASC) 5 MG tablet Take 5 mg by mouth daily.     aspirin EC 81 MG tablet Take 81 mg by mouth daily. Pt takes occasionally     lisinopril (ZESTRIL) 40 MG tablet Take 40 mg by mouth daily.     rosuvastatin (CRESTOR) 10 MG tablet Take by mouth.     tamsulosin (FLOMAX) 0.4 MG CAPS capsule Take 1 capsule (0.4 mg total) by mouth daily. 30 capsule 0   traMADol (ULTRAM) 50  MG tablet Take 1 tablet (50 mg total) by mouth every 6 (six) hours as needed. 15 tablet 0   No current facility-administered medications for this visit.    Physical Findings: In general this is a well appearing African American male in no acute distress. He's alert and oriented x4 and appropriate throughout the examination. Cardiopulmonary assessment is negative for acute distress and he exhibits normal effort.   Lab Findings: Lab Results  Component Value Date   WBC 8.8 12/08/2016   HGB 13.6 09/15/2022   HCT 40.0 09/15/2022   MCV 92.4 12/08/2016   PLT 217 12/08/2016    Radiographic Findings:  Patient underwent CT imaging in our clinic for post implant dosimetry. The CT will be reviewed by Dr. Tammi Klippel to confirm there is an adequate distribution of radioactive seeds throughout the prostate gland and ensure that there are no seeds in or near the rectum.  We suspect the final radiation plan and dosimetry will show appropriate coverage of the prostate gland. He understands that we will call and inform him of any unexpected findings on further review of his imaging and dosimetry.  Impression/Plan:  71 y.o. gentleman with Stage T1c adenocarcinoma of the prostate with Gleason score of 4+5, and PSA of 12.  The patient has had CT SIM prostate at [facility] on 10/04/22 and is tentatively scheduled to begin a 5 week course of daily external beam radiotherapy to the prostate and pelvic nodes on 10/17/2022  under the care of of Dr. Lynnette Caffey at [facility].  We will plan to see the patient back on an as-needed basis and I did encourage him to contact our office or return at any point if he has questions or concerns related to his previous brachytherapy seed implant.    Nicholos Johns, PA-C

## 2022-10-06 NOTE — Progress Notes (Signed)
  Radiation Oncology         [phone] ________________________________  Name: Spencer Mills MRN: [id number]  Date: 10/07/2022  DOB: 07-10-1951  COMPLEX SIMULATION NOTE  NARRATIVE:  The patient was brought to the [facility] today following prostate seed implantation approximately one month ago.  Identity was confirmed.  All relevant records and images related to the planned course of therapy were reviewed.  Then, the patient was set-up supine.  CT images were obtained.  The CT images were loaded into the planning software.  Then the prostate and rectum were contoured.  Treatment planning then occurred.  The implanted iodine 125 seeds were identified by the physics staff for projection of radiation distribution  I have requested : 3D Simulation  I have requested a DVH of the following structures: Prostate and rectum.  He had CT SIM prostate at [facility] 10/04/22 and is scheduled to start a 5 week course of daily external beam radiation with Dr. Lynnette Caffey at [facility] on 10/17/22.  ________________________________  Sheral Apley. Tammi Klippel, M.D.

## 2022-10-07 ENCOUNTER — Ambulatory Visit
Admission: RE | Admit: 2022-10-07 | Discharge: 2022-10-07 | Disposition: A | Discharge status: Home / Self Care | Payer: PPO | Source: Ambulatory Visit | Attending: Urology | Admitting: Urology

## 2022-10-07 ENCOUNTER — Other Ambulatory Visit: Payer: Self-pay

## 2022-10-07 ENCOUNTER — Telehealth: Payer: Self-pay | Admitting: Radiation Oncology

## 2022-10-07 ENCOUNTER — Encounter: Payer: Self-pay | Admitting: Urology

## 2022-10-07 ENCOUNTER — Ambulatory Visit
Admission: RE | Admit: 2022-10-07 | Discharge: 2022-10-07 | Disposition: A | Discharge status: Home / Self Care | Payer: PPO | Source: Ambulatory Visit | Attending: Radiation Oncology | Admitting: Radiation Oncology

## 2022-10-07 VITALS — BP 121/63 | HR 58 | Temp 97.7°F | Resp 20 | Ht 67.0 in | Wt 180.6 lb

## 2022-10-07 DIAGNOSIS — Z7982 Long term (current) use of aspirin: Secondary | ICD-10-CM | POA: Insufficient documentation

## 2022-10-07 DIAGNOSIS — C61 Malignant neoplasm of prostate: Secondary | ICD-10-CM | POA: Insufficient documentation

## 2022-10-07 DIAGNOSIS — Z79899 Other long term (current) drug therapy: Secondary | ICD-10-CM | POA: Insufficient documentation

## 2022-10-07 NOTE — Telephone Encounter (Signed)
Received MRR from [place] for dosimetry records. Request given to dosimetry team 10/07/22.

## 2022-10-07 NOTE — Progress Notes (Signed)
Post-seed nursing interview. I verified patient's identity and began nursing interview. Patient reports urinary urgency (see I-PSS score). No other issues reported at this time.  Meaningful use complete. I-PSS score of 8-mild. Tamsulosin as directed. Urology appt- None-Per patient w/ Dr. Alyson Ingles at [facility].  BP 121/63 (BP Location: Left Arm, Patient Position: Sitting, Cuff Size: Large)   Pulse (!) 58   Temp 97.7 F (36.5 C) (Temporal)   Resp 20   Ht '5\' 7"'$  (1.702 m)   Wt 180 lb 9.6 oz (81.9 kg)   SpO2 100%   BMI 28.29 kg/m   This concludes the interview.   Leandra Kern, LPN

## 2022-10-11 ENCOUNTER — Encounter: Payer: Self-pay | Admitting: Radiation Oncology

## 2022-10-11 ENCOUNTER — Ambulatory Visit: Payer: PPO

## 2022-10-11 DIAGNOSIS — C61 Malignant neoplasm of prostate: Secondary | ICD-10-CM | POA: Diagnosis not present

## 2022-10-11 DIAGNOSIS — Z191 Hormone sensitive malignancy status: Secondary | ICD-10-CM | POA: Diagnosis not present

## 2022-10-11 NOTE — Progress Notes (Signed)
  Radiation Oncology         [phone] ________________________________  Name: Spencer Mills MRN: [id number]  Date: 10/11/2022  DOB: 10/02/1951  3D Planning Note   Prostate Brachytherapy Post-Implant Dosimetry  Diagnosis:   72 y.o. gentleman with Stage T1c adenocarcinoma of the prostate with Gleason score of 4+5, and PSA of 12.  Narrative: On a previous date, Spencer Mills returned following prostate seed implantation for post implant planning. He underwent CT scan complex simulation to delineate the three-dimensional structures of the pelvis and demonstrate the radiation distribution.  Since that time, the seed localization, and complex isodose planning with dose volume histograms have now been completed.  Results:   Prostate Coverage - The dose of radiation delivered to the 90% or more of the prostate gland (D90) was 121.91% of the prescription dose. This exceeds our goal of greater than 90%. Rectal Sparing - The volume of rectal tissue receiving the prescription dose or higher was 0.0 cc. This falls under our thresholds tolerance of 1.0 cc.  Impression: The prostate seed implant appears to show adequate target coverage and appropriate rectal sparing.  Plan:  The patient will continue to follow with urology for ongoing PSA determinations. I would anticipate a high likelihood for local tumor control with minimal risk for rectal morbidity.  ________________________________  Sheral Apley Tammi Klippel, M.D.

## 2022-10-12 ENCOUNTER — Ambulatory Visit: Payer: PPO

## 2022-10-13 ENCOUNTER — Ambulatory Visit: Payer: PPO

## 2022-10-14 ENCOUNTER — Ambulatory Visit: Payer: PPO

## 2022-10-14 DIAGNOSIS — C61 Malignant neoplasm of prostate: Secondary | ICD-10-CM | POA: Diagnosis not present

## 2022-10-17 ENCOUNTER — Ambulatory Visit: Payer: PPO

## 2022-10-17 DIAGNOSIS — C61 Malignant neoplasm of prostate: Secondary | ICD-10-CM | POA: Diagnosis not present

## 2022-10-18 ENCOUNTER — Ambulatory Visit: Payer: PPO

## 2022-10-18 DIAGNOSIS — C61 Malignant neoplasm of prostate: Secondary | ICD-10-CM | POA: Diagnosis not present

## 2022-10-19 ENCOUNTER — Ambulatory Visit: Payer: PPO

## 2022-10-19 DIAGNOSIS — C61 Malignant neoplasm of prostate: Secondary | ICD-10-CM | POA: Diagnosis not present

## 2022-10-20 ENCOUNTER — Ambulatory Visit: Payer: PPO

## 2022-10-20 DIAGNOSIS — C61 Malignant neoplasm of prostate: Secondary | ICD-10-CM | POA: Diagnosis not present

## 2022-10-21 ENCOUNTER — Ambulatory Visit: Payer: PPO

## 2022-10-21 DIAGNOSIS — C61 Malignant neoplasm of prostate: Secondary | ICD-10-CM | POA: Diagnosis not present

## 2022-10-25 ENCOUNTER — Ambulatory Visit: Payer: PPO

## 2022-10-25 DIAGNOSIS — C61 Malignant neoplasm of prostate: Secondary | ICD-10-CM | POA: Diagnosis not present

## 2022-10-26 ENCOUNTER — Ambulatory Visit: Payer: PPO

## 2022-10-26 DIAGNOSIS — C61 Malignant neoplasm of prostate: Secondary | ICD-10-CM | POA: Diagnosis not present

## 2022-10-27 ENCOUNTER — Ambulatory Visit: Payer: PPO

## 2022-10-27 DIAGNOSIS — C61 Malignant neoplasm of prostate: Secondary | ICD-10-CM | POA: Diagnosis not present

## 2022-10-28 ENCOUNTER — Ambulatory Visit: Payer: PPO

## 2022-10-28 DIAGNOSIS — C61 Malignant neoplasm of prostate: Secondary | ICD-10-CM | POA: Diagnosis not present

## 2022-10-28 DIAGNOSIS — I10 Essential (primary) hypertension: Secondary | ICD-10-CM | POA: Diagnosis not present

## 2022-11-01 ENCOUNTER — Ambulatory Visit: Payer: PPO

## 2022-11-01 ENCOUNTER — Encounter: Payer: Self-pay | Admitting: Urology

## 2022-11-01 ENCOUNTER — Ambulatory Visit (INDEPENDENT_AMBULATORY_CARE_PROVIDER_SITE_OTHER): Payer: PPO | Admitting: Urology

## 2022-11-01 VITALS — BP 119/69 | HR 68

## 2022-11-01 DIAGNOSIS — C61 Malignant neoplasm of prostate: Secondary | ICD-10-CM

## 2022-11-01 DIAGNOSIS — N401 Enlarged prostate with lower urinary tract symptoms: Secondary | ICD-10-CM

## 2022-11-01 DIAGNOSIS — N138 Other obstructive and reflux uropathy: Secondary | ICD-10-CM

## 2022-11-01 DIAGNOSIS — R351 Nocturia: Secondary | ICD-10-CM

## 2022-11-01 LAB — MICROSCOPIC EXAMINATION: Bacteria, UA: NONE SEEN

## 2022-11-01 LAB — URINALYSIS, ROUTINE W REFLEX MICROSCOPIC
Bilirubin, UA: NEGATIVE
Glucose, UA: NEGATIVE
Ketones, UA: NEGATIVE
Leukocytes,UA: NEGATIVE
Nitrite, UA: NEGATIVE
Specific Gravity, UA: 1.025 (ref 1.005–1.030)
Urobilinogen, Ur: 0.2 mg/dL (ref 0.2–1.0)
pH, UA: 5 (ref 5.0–7.5)

## 2022-11-01 MED ORDER — TAMSULOSIN HCL 0.4 MG PO CAPS: 0.4000 mg | ORAL_CAPSULE | Freq: Every day | ORAL | 11 refills | Status: DC

## 2022-11-01 MED ORDER — TAMSULOSIN HCL 0.4 MG PO CAPS: 0.4000 mg | ORAL_CAPSULE | Freq: Every day | ORAL | 0 refills | Status: DC

## 2022-11-01 NOTE — Patient Instructions (Signed)

## 2022-11-01 NOTE — Progress Notes (Signed)
11/01/2022 2:17 PM   Spencer Mills May 17, 1951 [id number]  Referring provider: Monico Blitz, MD [place],  [place] [address]  Followup prostate cancer and nocturia   HPI: [name] is a 72yo here for followup for prostate cancer and BPH with nocturia. Mild hot flashes on ADT. He is finishing IMRT. IPSS 8 QOL 2 on flomax. Nocturia 1-2x. Urine stream strong on flomax. No straining to urinate. No urinary hesitancy.    PMH: Past Medical History:  Diagnosis Date   Arthritis    Cancer ([place])    Prostate cancer   Diabetes mellitus without complication ([place])    Hep C w/o coma, chronic ([place]) 08/2016    Surgical History: Past Surgical History:  Procedure Laterality Date   CYSTOSCOPY N/A 09/15/2022   Procedure: FLEXIBLE CYSTOSCOPY;  Surgeon: Cleon Gustin, MD;  Location[facility];  Service: Urology;  Laterality: N/A;  No seeds detected in bladder per Dr. Alyson Ingles   NO PAST SURGERIES     RADIOACTIVE SEED IMPLANT N/A 09/15/2022   Procedure: RADIOACTIVE SEED IMPLANT/BRACHYTHERAPY IMPLANT;  Surgeon: Cleon Gustin, MD;  Location: [facility];  Service: Urology;  Laterality: N/A;   SPACE OAR INSTILLATION N/A 09/15/2022   Procedure: SPACE OAR INSTILLATION;  Surgeon: Cleon Gustin, MD;  Location: [facility];  Service: Urology;  Laterality: N/A;    Home Medications:  Allergies as of 11/01/2022   No Known Allergies      Medication List        Accurate as of November 01, 2022  2:17 PM. If you have any questions, ask your nurse or doctor.          amLODipine 5 MG tablet Commonly known as: NORVASC Take 5 mg by mouth daily.   aspirin EC 81 MG tablet Take 81 mg by mouth daily. Pt takes occasionally   lisinopril 40 MG tablet Commonly known as: ZESTRIL Take 40 mg by mouth daily.   rosuvastatin 10 MG tablet Commonly known as: CRESTOR Take by mouth.   tamsulosin 0.4 MG Caps capsule Commonly known as:  FLOMAX Take 1 capsule (0.4 mg total) by mouth daily.   traMADol 50 MG tablet Commonly known as: Ultram Take 1 tablet (50 mg total) by mouth every 6 (six) hours as needed.        Allergies: No Known Allergies  Family History: Family History  Family history unknown: Yes    Social History:  reports that he has been smoking cigarettes. He started smoking about 56 years ago. He has been smoking an average of 1 pack per day. He has never used smokeless tobacco. He reports current alcohol use. He reports that he does not currently use drugs.  ROS: All other review of systems were reviewed and are negative except what is noted above in HPI  Physical Exam: BP 119/69   Pulse 68   Constitutional:  Alert and oriented, No acute distress. HEENT: [place] AT, moist mucus membranes.  Trachea midline, no masses. Cardiovascular: No clubbing, cyanosis, or edema. Respiratory: Normal respiratory effort, no increased work of breathing. GI: Abdomen is soft, nontender, nondistended, no abdominal masses GU: No CVA tenderness.  Lymph: No cervical or inguinal lymphadenopathy. Skin: No rashes, bruises or suspicious lesions. Neurologic: Grossly intact, no focal deficits, moving all 4 extremities. Psychiatric: Normal mood and affect.  Laboratory Data: Lab Results  Component Value Date   WBC 8.8 12/08/2016   HGB 13.6 09/15/2022   HCT 40.0 09/15/2022  MCV 92.4 12/08/2016   PLT 217 12/08/2016    Lab Results  Component Value Date   CREATININE 1.10 09/15/2022    No results found for: "PSA"  Lab Results  Component Value Date   TESTOSTERONE <3 (L) 07/05/2022    No results found for: "HGBA1C"  Urinalysis    Component Value Date/Time   APPEARANCEUR Clear 07/12/2022 1028   GLUCOSEU Negative 07/12/2022 1028   BILIRUBINUR Negative 07/12/2022 1028   PROTEINUR 3+ (A) 07/12/2022 1028   NITRITE Negative 07/12/2022 1028   LEUKOCYTESUR Negative 07/12/2022 1028    Lab Results  Component Value Date    LABMICR See below: 07/12/2022   WBCUA None seen 07/12/2022   LABEPIT 0-10 07/12/2022   MUCUS Present 07/12/2022   BACTERIA Few 07/12/2022    Pertinent Imaging:  No results found for this or any previous visit.  No results found for this or any previous visit.  No results found for this or any previous visit.  No results found for this or any previous visit.  No results found for this or any previous visit.  No valid procedures specified. No results found for this or any previous visit.  No results found for this or any previous visit.   Assessment & Plan:    1. Prostate cancer ([place]) -Followup 3 months with PSA - Urinalysis, Routine w reflex microscopic  2. Benign prostatic hyperplasia with urinary obstruction -continue flomax 0.'4mg'$  daily  3. Nocturia -Continue flomax 0.'4mg'$  daily   No follow-ups on file.  Nicolette Bang, MD  [facility] Urology [place]

## 2022-11-02 ENCOUNTER — Ambulatory Visit: Payer: PPO

## 2022-11-02 DIAGNOSIS — C61 Malignant neoplasm of prostate: Secondary | ICD-10-CM | POA: Diagnosis not present

## 2022-11-03 ENCOUNTER — Ambulatory Visit: Payer: PPO

## 2022-11-03 DIAGNOSIS — C61 Malignant neoplasm of prostate: Secondary | ICD-10-CM | POA: Diagnosis not present

## 2022-11-04 ENCOUNTER — Ambulatory Visit: Payer: PPO

## 2022-11-04 DIAGNOSIS — C61 Malignant neoplasm of prostate: Secondary | ICD-10-CM | POA: Diagnosis not present

## 2022-11-07 ENCOUNTER — Ambulatory Visit: Payer: PPO

## 2022-11-07 DIAGNOSIS — C61 Malignant neoplasm of prostate: Secondary | ICD-10-CM | POA: Diagnosis not present

## 2022-11-08 ENCOUNTER — Ambulatory Visit: Payer: PPO

## 2022-11-08 DIAGNOSIS — C61 Malignant neoplasm of prostate: Secondary | ICD-10-CM | POA: Diagnosis not present

## 2022-11-09 ENCOUNTER — Ambulatory Visit: Payer: PPO

## 2022-11-09 DIAGNOSIS — C61 Malignant neoplasm of prostate: Secondary | ICD-10-CM | POA: Diagnosis not present

## 2022-11-10 ENCOUNTER — Ambulatory Visit: Payer: PPO

## 2022-11-10 DIAGNOSIS — C61 Malignant neoplasm of prostate: Secondary | ICD-10-CM | POA: Diagnosis not present

## 2022-11-11 ENCOUNTER — Ambulatory Visit: Payer: PPO

## 2022-11-11 DIAGNOSIS — C61 Malignant neoplasm of prostate: Secondary | ICD-10-CM | POA: Diagnosis not present

## 2022-11-14 ENCOUNTER — Ambulatory Visit: Payer: PPO

## 2022-11-15 ENCOUNTER — Ambulatory Visit: Payer: PPO

## 2022-11-15 DIAGNOSIS — C61 Malignant neoplasm of prostate: Secondary | ICD-10-CM | POA: Diagnosis not present

## 2022-11-16 ENCOUNTER — Ambulatory Visit: Payer: PPO

## 2022-11-16 DIAGNOSIS — C61 Malignant neoplasm of prostate: Secondary | ICD-10-CM | POA: Diagnosis not present

## 2022-11-17 DIAGNOSIS — C61 Malignant neoplasm of prostate: Secondary | ICD-10-CM | POA: Diagnosis not present

## 2022-11-18 DIAGNOSIS — C61 Malignant neoplasm of prostate: Secondary | ICD-10-CM | POA: Diagnosis not present

## 2022-11-21 DIAGNOSIS — C61 Malignant neoplasm of prostate: Secondary | ICD-10-CM | POA: Diagnosis not present

## 2022-11-22 DIAGNOSIS — C61 Malignant neoplasm of prostate: Secondary | ICD-10-CM | POA: Diagnosis not present

## 2022-11-23 DIAGNOSIS — C61 Malignant neoplasm of prostate: Secondary | ICD-10-CM | POA: Diagnosis not present

## 2022-11-24 ENCOUNTER — Telehealth: Payer: Self-pay

## 2022-11-24 DIAGNOSIS — C61 Malignant neoplasm of prostate: Secondary | ICD-10-CM | POA: Diagnosis not present

## 2022-11-24 NOTE — Telephone Encounter (Signed)
Returned call and informed daughter to call radiation oncology concerning restrictions. Daughter voiced understanding.

## 2022-11-24 NOTE — Telephone Encounter (Signed)
Patient's daughter Hoover Grewe - [phone]  [facility] cancer treatment today.  Wanted to know if he can be around children or if there are any restrictions.  Please advise asap.

## 2022-11-28 DIAGNOSIS — I10 Essential (primary) hypertension: Secondary | ICD-10-CM | POA: Diagnosis not present

## 2022-12-20 ENCOUNTER — Other Ambulatory Visit: Payer: PPO

## 2022-12-22 DIAGNOSIS — C61 Malignant neoplasm of prostate: Secondary | ICD-10-CM | POA: Diagnosis not present

## 2022-12-23 DIAGNOSIS — Z0000 Encounter for general adult medical examination without abnormal findings: Secondary | ICD-10-CM | POA: Diagnosis not present

## 2022-12-23 DIAGNOSIS — Z1339 Encounter for screening examination for other mental health and behavioral disorders: Secondary | ICD-10-CM | POA: Diagnosis not present

## 2022-12-23 DIAGNOSIS — C61 Malignant neoplasm of prostate: Secondary | ICD-10-CM | POA: Diagnosis not present

## 2022-12-23 DIAGNOSIS — Z1331 Encounter for screening for depression: Secondary | ICD-10-CM | POA: Diagnosis not present

## 2022-12-23 DIAGNOSIS — E1165 Type 2 diabetes mellitus with hyperglycemia: Secondary | ICD-10-CM | POA: Diagnosis not present

## 2022-12-23 DIAGNOSIS — Z6827 Body mass index (BMI) 27.0-27.9, adult: Secondary | ICD-10-CM | POA: Diagnosis not present

## 2022-12-23 DIAGNOSIS — I10 Essential (primary) hypertension: Secondary | ICD-10-CM | POA: Diagnosis not present

## 2022-12-23 DIAGNOSIS — Z299 Encounter for prophylactic measures, unspecified: Secondary | ICD-10-CM | POA: Diagnosis not present

## 2022-12-23 DIAGNOSIS — I700 Atherosclerosis of aorta: Secondary | ICD-10-CM | POA: Diagnosis not present

## 2022-12-23 DIAGNOSIS — Z7189 Other specified counseling: Secondary | ICD-10-CM | POA: Diagnosis not present

## 2022-12-27 ENCOUNTER — Ambulatory Visit: Payer: PPO | Admitting: Urology

## 2022-12-27 ENCOUNTER — Other Ambulatory Visit: Payer: PPO

## 2023-01-03 ENCOUNTER — Other Ambulatory Visit: Payer: PPO

## 2023-01-10 ENCOUNTER — Ambulatory Visit: Payer: PPO | Admitting: Urology

## 2023-01-17 ENCOUNTER — Ambulatory Visit (INDEPENDENT_AMBULATORY_CARE_PROVIDER_SITE_OTHER): Payer: PPO | Admitting: Urology

## 2023-01-17 VITALS — BP 116/67 | HR 69

## 2023-01-17 DIAGNOSIS — N401 Enlarged prostate with lower urinary tract symptoms: Secondary | ICD-10-CM

## 2023-01-17 DIAGNOSIS — C61 Malignant neoplasm of prostate: Secondary | ICD-10-CM

## 2023-01-17 DIAGNOSIS — R351 Nocturia: Secondary | ICD-10-CM | POA: Diagnosis not present

## 2023-01-17 DIAGNOSIS — N138 Other obstructive and reflux uropathy: Secondary | ICD-10-CM

## 2023-01-17 LAB — MICROSCOPIC EXAMINATION: Bacteria, UA: NONE SEEN

## 2023-01-17 LAB — URINALYSIS, ROUTINE W REFLEX MICROSCOPIC
Bilirubin, UA: NEGATIVE
Glucose, UA: NEGATIVE
Leukocytes,UA: NEGATIVE
Nitrite, UA: NEGATIVE
Specific Gravity, UA: 1.025 (ref 1.005–1.030)
Urobilinogen, Ur: 0.2 mg/dL (ref 0.2–1.0)
pH, UA: 5.5 (ref 5.0–7.5)

## 2023-01-17 MED ORDER — TAMSULOSIN HCL 0.4 MG PO CAPS: 0.4000 mg | ORAL_CAPSULE | Freq: Every day | ORAL | 11 refills | Status: DC

## 2023-01-17 MED ORDER — LEUPROLIDE ACETATE (6 MONTH) 45 MG ~~LOC~~ KIT
45.0000 mg | PACK | Freq: Once | SUBCUTANEOUS | Status: AC
  Administered 2023-01-17: 45 mg via SUBCUTANEOUS

## 2023-01-17 NOTE — Progress Notes (Signed)
01/17/2023 1:36 PM   Spencer Mills 03-Nov-1950 [id number]  Referring provider: Kirstie Peri, MD [address] [place],  [place] [address]  Followup BPH and prostate cancer   HPI: Spencer Mills is a 72yo here for followup for prostate cancer and BPh and Nocturia. Nocturia 2x. IPSS 9 QOL 4 on no BPh therapy. He was previously on flomax 0.4mg  daily. No recent PSA. He has an intermittent weak stream. He has intermittent urinary hesitancy   PMH: Past Medical History:  Diagnosis Date   Arthritis    Cancer (HCC)    Prostate cancer   Diabetes mellitus without complication (HCC)    Hep C w/o coma, chronic (HCC) 08/2016    Surgical History: Past Surgical History:  Procedure Laterality Date   CYSTOSCOPY N/A 09/15/2022   Procedure: FLEXIBLE CYSTOSCOPY;  Surgeon: Malen Gauze, MD;  Location: [facility];  Service: Urology;  Laterality: N/A;  No seeds detected in bladder per Dr. Ronne Binning   NO PAST SURGERIES     RADIOACTIVE SEED IMPLANT N/A 09/15/2022   Procedure: RADIOACTIVE SEED IMPLANT/BRACHYTHERAPY IMPLANT;  Surgeon: Malen Gauze, MD;  Location: [facility];  Service: Urology;  Laterality: N/A;   SPACE OAR INSTILLATION N/A 09/15/2022   Procedure: SPACE OAR INSTILLATION;  Surgeon: Malen Gauze, MD;  Location: [facility];  Service: Urology;  Laterality: N/A;    Home Medications:  Allergies as of 01/17/2023   No Known Allergies      Medication List        Accurate as of January 17, 2023  1:36 PM. If you have any questions, ask your nurse or doctor.          amLODipine 5 MG tablet Commonly known as: NORVASC Take 5 mg by mouth daily.   aspirin EC 81 MG tablet Take 81 mg by mouth daily. Pt takes occasionally   lisinopril 40 MG tablet Commonly known as: ZESTRIL Take 40 mg by mouth daily.   rosuvastatin 10 MG tablet Commonly known as: CRESTOR Take by mouth.   tamsulosin 0.4 MG Caps capsule Commonly known as:  FLOMAX Take 1 capsule (0.4 mg total) by mouth daily.   traMADol 50 MG tablet Commonly known as: Ultram Take 1 tablet (50 mg total) by mouth every 6 (six) hours as needed.        Allergies: No Known Allergies  Family History: Family History  Family history unknown: Yes    Social History:  reports that he has been smoking cigarettes. He started smoking about 56 years ago. He has been smoking an average of 1 pack per day. He has never used smokeless tobacco. He reports current alcohol use. He reports that he does not currently use drugs.  ROS: All other review of systems were reviewed and are negative except what is noted above in HPI  Physical Exam: BP 116/67   Pulse 69   Constitutional:  Alert and oriented, No acute distress. HEENT: [place] AT, moist mucus membranes.  Trachea midline, no masses. Cardiovascular: No clubbing, cyanosis, or edema. Respiratory: Normal respiratory effort, no increased work of breathing. GI: Abdomen is soft, nontender, nondistended, no abdominal masses GU: No CVA tenderness.  Lymph: No cervical or inguinal lymphadenopathy. Skin: No rashes, bruises or suspicious lesions. Neurologic: Grossly intact, no focal deficits, moving all 4 extremities. Psychiatric: Normal mood and affect.  Laboratory Data: Lab Results  Component Value Date   WBC 8.8 12/08/2016   HGB 13.6 09/15/2022   HCT 40.0 09/15/2022  MCV 92.4 12/08/2016   PLT 217 12/08/2016    Lab Results  Component Value Date   CREATININE 1.10 09/15/2022    No results found for: "PSA"  Lab Results  Component Value Date   TESTOSTERONE <3 (L) 07/05/2022    No results found for: "HGBA1C"  Urinalysis    Component Value Date/Time   APPEARANCEUR Clear 11/01/2022 1345   GLUCOSEU Negative 11/01/2022 1345   BILIRUBINUR Negative 11/01/2022 1345   PROTEINUR 3+ (A) 11/01/2022 1345   NITRITE Negative 11/01/2022 1345   LEUKOCYTESUR Negative 11/01/2022 1345    Lab Results  Component Value Date    LABMICR See below: 11/01/2022   WBCUA 0-5 11/01/2022   LABEPIT 0-10 11/01/2022   MUCUS Present 07/12/2022   BACTERIA None seen 11/01/2022    Pertinent Imaging:  No results found for this or any previous visit.  No results found for this or any previous visit.  No results found for this or any previous visit.  No results found for this or any previous visit.  No results found for this or any previous visit.  No valid procedures specified. No results found for this or any previous visit.  No results found for this or any previous visit.   Assessment & Plan:    1. Prostate cancer (HCC) -followup 3 months with psa - Urinalysis, Routine w reflex microscopic - leuprolide (6 Month) (ELIGARD) injection 45 mg  2. Benign prostatic hyperplasia with urinary obstruction Restart flomax 0.4mg  daily  3. Nocturia -restart flomax 0.4mg  daily   No follow-ups on file.  Wilkie Aye, MD  [facility] Urology [place]

## 2023-01-18 LAB — PSA: Prostate Specific Ag, Serum: <0.1 ng/mL (ref 0.0–4.0)

## 2023-01-19 ENCOUNTER — Encounter: Payer: Self-pay | Admitting: Urology

## 2023-01-19 NOTE — Patient Instructions (Signed)

## 2023-01-25 ENCOUNTER — Telehealth: Payer: Self-pay

## 2023-01-25 NOTE — Telephone Encounter (Signed)
[employer] claim completed and faxed back to appropriate fax number [phone]

## 2023-01-28 DIAGNOSIS — I10 Essential (primary) hypertension: Secondary | ICD-10-CM | POA: Diagnosis not present

## 2023-01-29 DIAGNOSIS — E1122 Type 2 diabetes mellitus with diabetic chronic kidney disease: Secondary | ICD-10-CM | POA: Diagnosis not present

## 2023-01-29 DIAGNOSIS — E7800 Pure hypercholesterolemia, unspecified: Secondary | ICD-10-CM | POA: Diagnosis not present

## 2023-01-29 DIAGNOSIS — I10 Essential (primary) hypertension: Secondary | ICD-10-CM | POA: Diagnosis not present

## 2023-02-24 DIAGNOSIS — E7800 Pure hypercholesterolemia, unspecified: Secondary | ICD-10-CM | POA: Diagnosis not present

## 2023-02-24 DIAGNOSIS — Z299 Encounter for prophylactic measures, unspecified: Secondary | ICD-10-CM | POA: Diagnosis not present

## 2023-02-24 DIAGNOSIS — I10 Essential (primary) hypertension: Secondary | ICD-10-CM | POA: Diagnosis not present

## 2023-02-24 DIAGNOSIS — Z0000 Encounter for general adult medical examination without abnormal findings: Secondary | ICD-10-CM | POA: Diagnosis not present

## 2023-02-24 DIAGNOSIS — Z79899 Other long term (current) drug therapy: Secondary | ICD-10-CM | POA: Diagnosis not present

## 2023-02-24 DIAGNOSIS — R5383 Other fatigue: Secondary | ICD-10-CM | POA: Diagnosis not present

## 2023-02-24 DIAGNOSIS — C61 Malignant neoplasm of prostate: Secondary | ICD-10-CM | POA: Diagnosis not present

## 2023-02-28 DIAGNOSIS — I10 Essential (primary) hypertension: Secondary | ICD-10-CM | POA: Diagnosis not present

## 2023-03-31 DIAGNOSIS — I10 Essential (primary) hypertension: Secondary | ICD-10-CM | POA: Diagnosis not present

## 2023-04-05 DIAGNOSIS — Z299 Encounter for prophylactic measures, unspecified: Secondary | ICD-10-CM | POA: Diagnosis not present

## 2023-04-05 DIAGNOSIS — C61 Malignant neoplasm of prostate: Secondary | ICD-10-CM | POA: Diagnosis not present

## 2023-04-05 DIAGNOSIS — E1165 Type 2 diabetes mellitus with hyperglycemia: Secondary | ICD-10-CM | POA: Diagnosis not present

## 2023-04-05 DIAGNOSIS — I10 Essential (primary) hypertension: Secondary | ICD-10-CM | POA: Diagnosis not present

## 2023-04-05 DIAGNOSIS — M65321 Trigger finger, right index finger: Secondary | ICD-10-CM | POA: Diagnosis not present

## 2023-04-12 ENCOUNTER — Other Ambulatory Visit: Payer: PPO

## 2023-04-12 DIAGNOSIS — C61 Malignant neoplasm of prostate: Secondary | ICD-10-CM | POA: Diagnosis not present

## 2023-04-14 LAB — PSA, TOTAL AND FREE
PSA, Free: <0.02 ng/mL
Prostate Specific Ag, Serum: <0.1 ng/mL (ref 0.0–4.0)

## 2023-04-18 ENCOUNTER — Ambulatory Visit: Payer: PPO | Admitting: Urology

## 2023-04-18 DIAGNOSIS — C61 Malignant neoplasm of prostate: Secondary | ICD-10-CM

## 2023-04-19 ENCOUNTER — Ambulatory Visit (INDEPENDENT_AMBULATORY_CARE_PROVIDER_SITE_OTHER): Payer: PPO | Admitting: Urology

## 2023-04-19 VITALS — BP 98/59 | HR 79

## 2023-04-19 DIAGNOSIS — C61 Malignant neoplasm of prostate: Secondary | ICD-10-CM

## 2023-04-19 DIAGNOSIS — N138 Other obstructive and reflux uropathy: Secondary | ICD-10-CM | POA: Diagnosis not present

## 2023-04-19 DIAGNOSIS — N401 Enlarged prostate with lower urinary tract symptoms: Secondary | ICD-10-CM

## 2023-04-19 DIAGNOSIS — R351 Nocturia: Secondary | ICD-10-CM | POA: Diagnosis not present

## 2023-04-19 NOTE — Progress Notes (Signed)
04/19/2023 3:56 PM   Spencer Mills [birth year] [id number]  Referring provider: Kirstie Peri, MD [address] [place],  [place] [address]  Followup BPh and prostate cancer   HPI: [name] is a 72 yo her for followup for prostate cancer and BPH. IPSS 6 QOL 0 on no BPh therapy. Urine stream strong. No straining to urinate. No dysuria. Nocturia 1-2x. PSA undetectable on ADT. Mild hot flashes. He is due for eligard in sept 2024.    PMH: Past Medical History:  Diagnosis Date   Arthritis    Cancer (HCC)    Prostate cancer   Diabetes mellitus without complication (HCC)    Hep C w/o coma, chronic (HCC) 08/2016    Surgical History: Past Surgical History:  Procedure Laterality Date   CYSTOSCOPY N/A 09/15/2022   Procedure: FLEXIBLE CYSTOSCOPY;  Surgeon: Malen Gauze, MD;  Location: [facility];  Service: Urology;  Laterality: N/A;  No seeds detected in bladder per Dr. Ronne Binning   NO PAST SURGERIES     RADIOACTIVE SEED IMPLANT N/A 09/15/2022   Procedure: RADIOACTIVE SEED IMPLANT/BRACHYTHERAPY IMPLANT;  Surgeon: Malen Gauze, MD;  Location: [facility];  Service: Urology;  Laterality: N/A;   SPACE OAR INSTILLATION N/A 09/15/2022   Procedure: SPACE OAR INSTILLATION;  Surgeon: Malen Gauze, MD;  Location: [facility];  Service: Urology;  Laterality: N/A;    Home Medications:  Allergies as of 04/19/2023   No Known Allergies      Medication List        Accurate as of April 19, 2023  3:56 PM. If you have any questions, ask your nurse or doctor.          amLODipine 5 MG tablet Commonly known as: NORVASC Take 5 mg by mouth daily.   aspirin EC 81 MG tablet Take 81 mg by mouth daily. Pt takes occasionally   lisinopril 40 MG tablet Commonly known as: ZESTRIL Take 40 mg by mouth daily.   rosuvastatin 10 MG tablet Commonly known as: CRESTOR Take by mouth.   tamsulosin 0.4 MG Caps capsule Commonly known as:  FLOMAX Take 1 capsule (0.4 mg total) by mouth daily.   traMADol 50 MG tablet Commonly known as: Ultram Take 1 tablet (50 mg total) by mouth every 6 (six) hours as needed.        Allergies: No Known Allergies  Family History: Family History  Family history unknown: Yes    Social History:  reports that he has been smoking cigarettes. He started smoking about 57 years ago. He has been smoking an average of 1 pack per day. He has never used smokeless tobacco. He reports current alcohol use. He reports that he does not currently use drugs.  ROS: All other review of systems were reviewed and are negative except what is noted above in HPI  Physical Exam: BP (!) 98/59   Pulse 79   Constitutional:  Alert and oriented, No acute distress. HEENT: [place] AT, moist mucus membranes.  Trachea midline, no masses. Cardiovascular: No clubbing, cyanosis, or edema. Respiratory: Normal respiratory effort, no increased work of breathing. GI: Abdomen is soft, nontender, nondistended, no abdominal masses GU: No CVA tenderness.  Lymph: No cervical or inguinal lymphadenopathy. Skin: No rashes, bruises or suspicious lesions. Neurologic: Grossly intact, no focal deficits, moving all 4 extremities. Psychiatric: Normal mood and affect.  Laboratory Data: Lab Results  Component Value Date   WBC 8.8 12/08/2016   HGB 13.6 09/15/2022  HCT 40.0 09/15/2022   MCV 92.4 12/08/2016   PLT 217 12/08/2016    Lab Results  Component Value Date   CREATININE 1.10 09/15/2022    No results found for: "PSA"  Lab Results  Component Value Date   TESTOSTERONE <3 (L) 07/05/2022    No results found for: "HGBA1C"  Urinalysis    Component Value Date/Time   APPEARANCEUR Clear 01/17/2023 1329   GLUCOSEU Negative 01/17/2023 1329   BILIRUBINUR Negative 01/17/2023 1329   PROTEINUR 3+ (A) 01/17/2023 1329   NITRITE Negative 01/17/2023 1329   LEUKOCYTESUR Negative 01/17/2023 1329    Lab Results  Component Value  Date   LABMICR See below: 01/17/2023   WBCUA 0-5 01/17/2023   LABEPIT 0-10 01/17/2023   MUCUS Present 07/12/2022   BACTERIA None seen 01/17/2023    Pertinent Imaging:  No results found for this or any previous visit.  No results found for this or any previous visit.  No results found for this or any previous visit.  No results found for this or any previous visit.  No results found for this or any previous visit.  No valid procedures specified. No results found for this or any previous visit.  No results found for this or any previous visit.   Assessment & Plan:    1. Prostate cancer (HCC) Followup 6 months with a PSA  2. Benign prostatic hyperplasia with urinary obstruction Patient defers therapy  3. Nocturia Patient defers therapy at this time   No follow-ups on file.  Wilkie Aye, MD  [facility] Urology [place]

## 2023-04-25 ENCOUNTER — Encounter: Payer: Self-pay | Admitting: Urology

## 2023-04-25 NOTE — Patient Instructions (Signed)

## 2023-04-30 DIAGNOSIS — I10 Essential (primary) hypertension: Secondary | ICD-10-CM | POA: Diagnosis not present

## 2023-06-12 DIAGNOSIS — L820 Inflamed seborrheic keratosis: Secondary | ICD-10-CM | POA: Diagnosis not present

## 2023-06-12 DIAGNOSIS — L708 Other acne: Secondary | ICD-10-CM | POA: Diagnosis not present

## 2023-06-21 DIAGNOSIS — B079 Viral wart, unspecified: Secondary | ICD-10-CM | POA: Diagnosis not present

## 2023-06-21 DIAGNOSIS — B078 Other viral warts: Secondary | ICD-10-CM | POA: Diagnosis not present

## 2023-06-21 DIAGNOSIS — Z299 Encounter for prophylactic measures, unspecified: Secondary | ICD-10-CM | POA: Diagnosis not present

## 2023-06-21 DIAGNOSIS — I10 Essential (primary) hypertension: Secondary | ICD-10-CM | POA: Diagnosis not present

## 2023-06-21 DIAGNOSIS — D485 Neoplasm of uncertain behavior of skin: Secondary | ICD-10-CM | POA: Diagnosis not present

## 2023-06-27 DIAGNOSIS — Z299 Encounter for prophylactic measures, unspecified: Secondary | ICD-10-CM | POA: Diagnosis not present

## 2023-06-27 DIAGNOSIS — C61 Malignant neoplasm of prostate: Secondary | ICD-10-CM | POA: Diagnosis not present

## 2023-06-27 DIAGNOSIS — I700 Atherosclerosis of aorta: Secondary | ICD-10-CM | POA: Diagnosis not present

## 2023-06-27 DIAGNOSIS — I10 Essential (primary) hypertension: Secondary | ICD-10-CM | POA: Diagnosis not present

## 2023-07-01 DIAGNOSIS — I10 Essential (primary) hypertension: Secondary | ICD-10-CM | POA: Diagnosis not present

## 2023-07-10 DIAGNOSIS — E1165 Type 2 diabetes mellitus with hyperglycemia: Secondary | ICD-10-CM | POA: Diagnosis not present

## 2023-07-10 DIAGNOSIS — Z6827 Body mass index (BMI) 27.0-27.9, adult: Secondary | ICD-10-CM | POA: Diagnosis not present

## 2023-07-10 DIAGNOSIS — I10 Essential (primary) hypertension: Secondary | ICD-10-CM | POA: Diagnosis not present

## 2023-07-10 DIAGNOSIS — Z299 Encounter for prophylactic measures, unspecified: Secondary | ICD-10-CM | POA: Diagnosis not present

## 2023-07-17 ENCOUNTER — Other Ambulatory Visit: Payer: PPO

## 2023-07-17 DIAGNOSIS — C61 Malignant neoplasm of prostate: Secondary | ICD-10-CM

## 2023-07-18 LAB — PSA: Prostate Specific Ag, Serum: <0.1 ng/mL (ref 0.0–4.0)

## 2023-07-24 ENCOUNTER — Ambulatory Visit: Payer: PPO | Admitting: Urology

## 2023-07-24 VITALS — BP 106/65 | HR 56

## 2023-07-24 DIAGNOSIS — N401 Enlarged prostate with lower urinary tract symptoms: Secondary | ICD-10-CM | POA: Diagnosis not present

## 2023-07-24 DIAGNOSIS — N138 Other obstructive and reflux uropathy: Secondary | ICD-10-CM | POA: Diagnosis not present

## 2023-07-24 DIAGNOSIS — C61 Malignant neoplasm of prostate: Secondary | ICD-10-CM

## 2023-07-24 DIAGNOSIS — R351 Nocturia: Secondary | ICD-10-CM

## 2023-07-24 LAB — URINALYSIS, ROUTINE W REFLEX MICROSCOPIC
Bilirubin, UA: NEGATIVE
Glucose, UA: NEGATIVE
Leukocytes,UA: NEGATIVE
Nitrite, UA: NEGATIVE
RBC, UA: NEGATIVE
Specific Gravity, UA: 1.025 (ref 1.005–1.030)
Urobilinogen, Ur: 1 mg/dL (ref 0.2–1.0)
pH, UA: 6 (ref 5.0–7.5)

## 2023-07-24 LAB — MICROSCOPIC EXAMINATION
Bacteria, UA: NONE SEEN
WBC, UA: NONE SEEN /hpf (ref 0–5)

## 2023-07-24 MED ORDER — LEUPROLIDE ACETATE (6 MONTH) 45 MG ~~LOC~~ KIT
45.0000 mg | PACK | SUBCUTANEOUS | Status: AC
  Administered 2023-07-24 – 2024-01-22 (×2): 45 mg via SUBCUTANEOUS

## 2023-07-24 MED ORDER — LEUPROLIDE ACETATE (6 MONTH) 45 MG ~~LOC~~ KIT: 45.0000 mg | PACK | Freq: Once | SUBCUTANEOUS | Status: AC

## 2023-07-24 MED ORDER — TAMSULOSIN HCL 0.4 MG PO CAPS: 0.4000 mg | ORAL_CAPSULE | Freq: Every day | ORAL | 11 refills | Status: DC

## 2023-07-24 NOTE — Patient Instructions (Signed)

## 2023-07-24 NOTE — Progress Notes (Unsigned)
07/24/2023 2:30 PM   Spencer Mills December 12, 1950 [id number]  Referring provider: Kirstie Peri, MD [address] [place],  [place] [address]  No chief complaint on file.   HPI: Spencer Mills is a 72yo here for followup for prostate cancer and BPH. PSA remains undetectable on ADT. IPSS 7 QOL 2 on flomax 0.4mg  daily. He has fecal incontinence and takes imodium as needed. He has mild hot flashes.    PMH: Past Medical History:  Diagnosis Date   Arthritis    Cancer (HCC)    Prostate cancer   Diabetes mellitus without complication (HCC)    Hep C w/o coma, chronic (HCC) 08/2016    Surgical History: Past Surgical History:  Procedure Laterality Date   CYSTOSCOPY N/A 09/15/2022   Procedure: FLEXIBLE CYSTOSCOPY;  Surgeon: Malen Gauze, MD;  Location: [facility];  Service: Urology;  Laterality: N/A;  No seeds detected in bladder per Dr. Ronne Binning   NO PAST SURGERIES     RADIOACTIVE SEED IMPLANT N/A 09/15/2022   Procedure: RADIOACTIVE SEED IMPLANT/BRACHYTHERAPY IMPLANT;  Surgeon: Malen Gauze, MD;  Location[facility];  Service: Urology;  Laterality: N/A;   SPACE OAR INSTILLATION N/A 09/15/2022   Procedure: SPACE OAR INSTILLATION;  Surgeon: Malen Gauze, MD;  Location: [facility];  Service: Urology;  Laterality: N/A;    Home Medications:  Allergies as of 07/24/2023   No Known Allergies      Medication List        Accurate as of July 24, 2023  2:30 PM. If you have any questions, ask your nurse or doctor.          amLODipine 5 MG tablet Commonly known as: NORVASC Take 5 mg by mouth daily.   aspirin EC 81 MG tablet Take 81 mg by mouth daily. Pt takes occasionally   lisinopril 40 MG tablet Commonly known as: ZESTRIL Take 40 mg by mouth daily.   rosuvastatin 10 MG tablet Commonly known as: CRESTOR Take by mouth.   tamsulosin 0.4 MG Caps capsule Commonly known as: FLOMAX Take 1 capsule (0.4 mg total) by  mouth daily.   traMADol 50 MG tablet Commonly known as: Ultram Take 1 tablet (50 mg total) by mouth every 6 (six) hours as needed.        Allergies: No Known Allergies  Family History: Family History  Family history unknown: Yes    Social History:  reports that he has been smoking cigarettes. He started smoking about 57 years ago. He has a 57.2 pack-year smoking history. He has never used smokeless tobacco. He reports current alcohol use. He reports that he does not currently use drugs.  ROS: All other review of systems were reviewed and are negative except what is noted above in HPI  Physical Exam: BP 106/65   Pulse (!) 56   Constitutional:  Alert and oriented, No acute distress. HEENT: [place] AT, moist mucus membranes.  Trachea midline, no masses. Cardiovascular: No clubbing, cyanosis, or edema. Respiratory: Normal respiratory effort, no increased work of breathing. GI: Abdomen is soft, nontender, nondistended, no abdominal masses GU: No CVA tenderness.  Lymph: No cervical or inguinal lymphadenopathy. Skin: No rashes, bruises or suspicious lesions. Neurologic: Grossly intact, no focal deficits, moving all 4 extremities. Psychiatric: Normal mood and affect.  Laboratory Data: Lab Results  Component Value Date   WBC 8.8 12/08/2016   HGB 13.6 09/15/2022   HCT 40.0 09/15/2022   MCV 92.4 12/08/2016   PLT  217 12/08/2016    Lab Results  Component Value Date   CREATININE 1.10 09/15/2022    No results found for: "PSA"  Lab Results  Component Value Date   TESTOSTERONE <3 (L) 07/05/2022    No results found for: "HGBA1C"  Urinalysis    Component Value Date/Time   APPEARANCEUR Clear 01/17/2023 1329   GLUCOSEU Negative 01/17/2023 1329   BILIRUBINUR Negative 01/17/2023 1329   PROTEINUR 3+ (A) 01/17/2023 1329   NITRITE Negative 01/17/2023 1329   LEUKOCYTESUR Negative 01/17/2023 1329    Lab Results  Component Value Date   LABMICR See below: 01/17/2023   WBCUA 0-5  01/17/2023   LABEPIT 0-10 01/17/2023   MUCUS Present 07/12/2022   BACTERIA None seen 01/17/2023    Pertinent Imaging: *** No results found for this or any previous visit.  No results found for this or any previous visit.  No results found for this or any previous visit.  No results found for this or any previous visit.  No results found for this or any previous visit.  No valid procedures specified. No results found for this or any previous visit.  No results found for this or any previous visit.   Assessment & Plan:    1. Prostate cancer (HCC) Followup 6 months with PSA - leuprolide (6 Month) (ELIGARD) injection 45 mg - Urinalysis, Routine w reflex microscopic - leuprolide (6 Month) (ELIGARD) injection 45 mg  2. Benign prostatic hyperplasia with urinary obstruction -continue flomax 0.4mg  daily  3. Nocturia -continue flomax 0.4mg  daily   No follow-ups on file.  Wilkie Aye, MD  [facility] Urology [place]

## 2023-07-25 ENCOUNTER — Encounter: Payer: Self-pay | Admitting: Urology

## 2023-07-31 DIAGNOSIS — I10 Essential (primary) hypertension: Secondary | ICD-10-CM | POA: Diagnosis not present

## 2023-08-01 DIAGNOSIS — Z23 Encounter for immunization: Secondary | ICD-10-CM | POA: Diagnosis not present

## 2023-08-30 DIAGNOSIS — I10 Essential (primary) hypertension: Secondary | ICD-10-CM | POA: Diagnosis not present

## 2023-09-29 DIAGNOSIS — I10 Essential (primary) hypertension: Secondary | ICD-10-CM | POA: Diagnosis not present

## 2023-10-16 DIAGNOSIS — Z299 Encounter for prophylactic measures, unspecified: Secondary | ICD-10-CM | POA: Diagnosis not present

## 2023-10-16 DIAGNOSIS — F17210 Nicotine dependence, cigarettes, uncomplicated: Secondary | ICD-10-CM | POA: Diagnosis not present

## 2023-10-16 DIAGNOSIS — E1169 Type 2 diabetes mellitus with other specified complication: Secondary | ICD-10-CM | POA: Diagnosis not present

## 2023-10-16 DIAGNOSIS — R809 Proteinuria, unspecified: Secondary | ICD-10-CM | POA: Diagnosis not present

## 2023-10-16 DIAGNOSIS — E1129 Type 2 diabetes mellitus with other diabetic kidney complication: Secondary | ICD-10-CM | POA: Diagnosis not present

## 2023-10-16 DIAGNOSIS — M7551 Bursitis of right shoulder: Secondary | ICD-10-CM | POA: Diagnosis not present

## 2023-10-16 DIAGNOSIS — I10 Essential (primary) hypertension: Secondary | ICD-10-CM | POA: Diagnosis not present

## 2023-10-30 DIAGNOSIS — I10 Essential (primary) hypertension: Secondary | ICD-10-CM | POA: Diagnosis not present

## 2023-11-29 DIAGNOSIS — I10 Essential (primary) hypertension: Secondary | ICD-10-CM | POA: Diagnosis not present

## 2023-12-26 DIAGNOSIS — M79601 Pain in right arm: Secondary | ICD-10-CM | POA: Diagnosis not present

## 2023-12-26 DIAGNOSIS — Z299 Encounter for prophylactic measures, unspecified: Secondary | ICD-10-CM | POA: Diagnosis not present

## 2023-12-26 DIAGNOSIS — Z0000 Encounter for general adult medical examination without abnormal findings: Secondary | ICD-10-CM | POA: Diagnosis not present

## 2023-12-26 DIAGNOSIS — I10 Essential (primary) hypertension: Secondary | ICD-10-CM | POA: Diagnosis not present

## 2023-12-26 DIAGNOSIS — Z1331 Encounter for screening for depression: Secondary | ICD-10-CM | POA: Diagnosis not present

## 2023-12-28 DIAGNOSIS — C61 Malignant neoplasm of prostate: Secondary | ICD-10-CM | POA: Diagnosis not present

## 2023-12-29 DIAGNOSIS — I10 Essential (primary) hypertension: Secondary | ICD-10-CM | POA: Diagnosis not present

## 2024-01-05 DIAGNOSIS — Z7189 Other specified counseling: Secondary | ICD-10-CM | POA: Diagnosis not present

## 2024-01-05 DIAGNOSIS — Z299 Encounter for prophylactic measures, unspecified: Secondary | ICD-10-CM | POA: Diagnosis not present

## 2024-01-05 DIAGNOSIS — Z1331 Encounter for screening for depression: Secondary | ICD-10-CM | POA: Diagnosis not present

## 2024-01-05 DIAGNOSIS — Z0000 Encounter for general adult medical examination without abnormal findings: Secondary | ICD-10-CM | POA: Diagnosis not present

## 2024-01-05 DIAGNOSIS — C61 Malignant neoplasm of prostate: Secondary | ICD-10-CM | POA: Diagnosis not present

## 2024-01-05 DIAGNOSIS — I10 Essential (primary) hypertension: Secondary | ICD-10-CM | POA: Diagnosis not present

## 2024-01-05 DIAGNOSIS — Z1339 Encounter for screening examination for other mental health and behavioral disorders: Secondary | ICD-10-CM | POA: Diagnosis not present

## 2024-01-05 DIAGNOSIS — R52 Pain, unspecified: Secondary | ICD-10-CM | POA: Diagnosis not present

## 2024-01-05 DIAGNOSIS — E1169 Type 2 diabetes mellitus with other specified complication: Secondary | ICD-10-CM | POA: Diagnosis not present

## 2024-01-15 ENCOUNTER — Other Ambulatory Visit: Payer: PPO

## 2024-01-15 DIAGNOSIS — C61 Malignant neoplasm of prostate: Secondary | ICD-10-CM

## 2024-01-16 LAB — PSA: Prostate Specific Ag, Serum: <0.1 ng/mL (ref 0.0–4.0)

## 2024-01-22 ENCOUNTER — Ambulatory Visit: Payer: PPO | Admitting: Urology

## 2024-01-22 ENCOUNTER — Encounter: Payer: Self-pay | Admitting: Urology

## 2024-01-22 VITALS — BP 113/65 | HR 65

## 2024-01-22 DIAGNOSIS — N401 Enlarged prostate with lower urinary tract symptoms: Secondary | ICD-10-CM | POA: Diagnosis not present

## 2024-01-22 DIAGNOSIS — N138 Other obstructive and reflux uropathy: Secondary | ICD-10-CM

## 2024-01-22 DIAGNOSIS — C61 Malignant neoplasm of prostate: Secondary | ICD-10-CM

## 2024-01-22 DIAGNOSIS — R351 Nocturia: Secondary | ICD-10-CM

## 2024-01-22 LAB — URINALYSIS, ROUTINE W REFLEX MICROSCOPIC
Bilirubin, UA: NEGATIVE
Glucose, UA: NEGATIVE
Ketones, UA: NEGATIVE
Leukocytes,UA: NEGATIVE
Nitrite, UA: NEGATIVE
Specific Gravity, UA: 1.03 (ref 1.005–1.030)
Urobilinogen, Ur: 1 mg/dL (ref 0.2–1.0)
pH, UA: 6 (ref 5.0–7.5)

## 2024-01-22 LAB — MICROSCOPIC EXAMINATION
Bacteria, UA: NONE SEEN
Epithelial Cells (non renal): NONE SEEN /HPF (ref 0–10)
WBC, UA: NONE SEEN /HPF (ref 0–5)

## 2024-01-22 MED ORDER — SOLIFENACIN SUCCINATE 5 MG PO TABS: 5.0000 mg | ORAL_TABLET | Freq: Every day | ORAL | 11 refills | Status: DC

## 2024-01-22 MED ORDER — GEMTESA 75 MG PO TABS: 1.0000 | ORAL_TABLET | Freq: Every day | ORAL | Status: DC

## 2024-01-22 NOTE — Patient Instructions (Signed)

## 2024-01-22 NOTE — Progress Notes (Signed)
 01/22/2024 2:55 PM   Lesleigh Noe Oct 29, 1951 [id number]  Referring provider: Kirstie Peri, MD [address] [place],  [place] [address]  Followup prostate cancer   HPI: Mr Bozard is a 73yo here for followup for prostate cancer and BPH with nocturia. PSA undetectable on ADT. He will finish ADT in 08/2024. Mild hot flashes. IPSS 9 QOL 3 on flomax 0.4mg  daily. He has urgency and urinary frequency every 1-2 hours. No straining to urinate. He has issues with diarrhea and took imodium    PMH: Past Medical History:  Diagnosis Date   Arthritis    Cancer (HCC)    Prostate cancer   Diabetes mellitus without complication (HCC)    Hep C w/o coma, chronic (HCC) 08/2016    Surgical History: Past Surgical History:  Procedure Laterality Date   CYSTOSCOPY N/A 09/15/2022   Procedure: FLEXIBLE CYSTOSCOPY;  Surgeon: Malen Gauze, MD;  Location: [facility];  Service: Urology;  Laterality: N/A;  No seeds detected in bladder per Dr. Ronne Binning   NO PAST SURGERIES     RADIOACTIVE SEED IMPLANT N/A 09/15/2022   Procedure: RADIOACTIVE SEED IMPLANT/BRACHYTHERAPY IMPLANT;  Surgeon: Malen Gauze, MD;  Location: [facility];  Service: Urology;  Laterality: N/A;   SPACE OAR INSTILLATION N/A 09/15/2022   Procedure: SPACE OAR INSTILLATION;  Surgeon: Malen Gauze, MD;  Location: [facility];  Service: Urology;  Laterality: N/A;    Home Medications:  Allergies as of 01/22/2024   No Known Allergies      Medication List        Accurate as of January 22, 2024  2:55 PM. If you have any questions, ask your nurse or doctor.          amLODipine 5 MG tablet Commonly known as: NORVASC Take 5 mg by mouth daily.   aspirin EC 81 MG tablet Take 81 mg by mouth daily. Pt takes occasionally   lisinopril 40 MG tablet Commonly known as: ZESTRIL Take 40 mg by mouth daily.   rosuvastatin 10 MG tablet Commonly known as: CRESTOR Take by mouth.    tamsulosin 0.4 MG Caps capsule Commonly known as: FLOMAX Take 1 capsule (0.4 mg total) by mouth daily.        Allergies: No Known Allergies  Family History: Family History  Family history unknown: Yes    Social History:  reports that he has been smoking cigarettes. He started smoking about 57 years ago. He has a 57.7 pack-year smoking history. He has never used smokeless tobacco. He reports current alcohol use. He reports that he does not currently use drugs.  ROS: All other review of systems were reviewed and are negative except what is noted above in HPI  Physical Exam: BP 113/65   Pulse 65   Constitutional:  Alert and oriented, No acute distress. HEENT: [place] AT, moist mucus membranes.  Trachea midline, no masses. Cardiovascular: No clubbing, cyanosis, or edema. Respiratory: Normal respiratory effort, no increased work of breathing. GI: Abdomen is soft, nontender, nondistended, no abdominal masses GU: No CVA tenderness.  Lymph: No cervical or inguinal lymphadenopathy. Skin: No rashes, bruises or suspicious lesions. Neurologic: Grossly intact, no focal deficits, moving all 4 extremities. Psychiatric: Normal mood and affect.  Laboratory Data: Lab Results  Component Value Date   WBC 8.8 12/08/2016   HGB 13.6 09/15/2022   HCT 40.0 09/15/2022   MCV 92.4 12/08/2016   PLT 217 12/08/2016    Lab Results  Component Value  Date   CREATININE 1.10 09/15/2022    No results found for: "PSA"  Lab Results  Component Value Date   TESTOSTERONE <3 (L) 07/05/2022    No results found for: "HGBA1C"  Urinalysis    Component Value Date/Time   APPEARANCEUR Clear 07/24/2023 1420   GLUCOSEU Negative 07/24/2023 1420   BILIRUBINUR Negative 07/24/2023 1420   PROTEINUR 2+ (A) 07/24/2023 1420   NITRITE Negative 07/24/2023 1420   LEUKOCYTESUR Negative 07/24/2023 1420    Lab Results  Component Value Date   LABMICR See below: 07/24/2023   WBCUA None seen 07/24/2023   LABEPIT  0-10 07/24/2023   MUCUS Present 07/12/2022   BACTERIA None seen 07/24/2023    Pertinent Imaging:  No results found for this or any previous visit.  No results found for this or any previous visit.  No results found for this or any previous visit.  No results found for this or any previous visit.  No results found for this or any previous visit.  No results found for this or any previous visit.  No results found for this or any previous visit.  No results found for this or any previous visit.   Assessment & Plan:    1. Prostate cancer (HCC) (Primary) Followup 6 months with PSA. Eligard 45mg  today which is his last dose of ADT.  - Urinalysis, Routine w reflex microscopic  2. Benign prostatic hyperplasia with urinary obstruction -continue flomax 0.4mg  daily  3. Nocturia -continue flomax 0.4mg  daily  4. Urinary urgency -we will trial vesicare 5mg  daily  No follow-ups on file.  Wilkie Aye, MD  [facility] [place]

## 2024-01-22 NOTE — Progress Notes (Signed)
 Eligard SubQ Injection   Due to Prostate Cancer patient is present today for a Eligard Injection. Order reviewed.  Medication: Eligard 6 month Dose: 45 mg  Location: right hip  site prepped and cleaned with alcohol prior to giving injection.  Lot: [id number] Exp: 03/2025  Patient tolerated well, no complications were noted  Performed by: Guss Bunde, CMA

## 2024-01-28 DIAGNOSIS — I10 Essential (primary) hypertension: Secondary | ICD-10-CM | POA: Diagnosis not present

## 2024-02-05 DIAGNOSIS — M19011 Primary osteoarthritis, right shoulder: Secondary | ICD-10-CM | POA: Diagnosis not present

## 2024-02-12 DIAGNOSIS — M19011 Primary osteoarthritis, right shoulder: Secondary | ICD-10-CM | POA: Diagnosis not present

## 2024-02-26 DIAGNOSIS — I10 Essential (primary) hypertension: Secondary | ICD-10-CM | POA: Diagnosis not present

## 2024-02-26 DIAGNOSIS — E1169 Type 2 diabetes mellitus with other specified complication: Secondary | ICD-10-CM | POA: Diagnosis not present

## 2024-02-26 DIAGNOSIS — I700 Atherosclerosis of aorta: Secondary | ICD-10-CM | POA: Diagnosis not present

## 2024-02-26 DIAGNOSIS — R350 Frequency of micturition: Secondary | ICD-10-CM | POA: Diagnosis not present

## 2024-02-26 DIAGNOSIS — Z0000 Encounter for general adult medical examination without abnormal findings: Secondary | ICD-10-CM | POA: Diagnosis not present

## 2024-02-26 DIAGNOSIS — Z299 Encounter for prophylactic measures, unspecified: Secondary | ICD-10-CM | POA: Diagnosis not present

## 2024-02-26 DIAGNOSIS — C61 Malignant neoplasm of prostate: Secondary | ICD-10-CM | POA: Diagnosis not present

## 2024-02-28 DIAGNOSIS — I10 Essential (primary) hypertension: Secondary | ICD-10-CM | POA: Diagnosis not present

## 2024-03-29 DIAGNOSIS — M19011 Primary osteoarthritis, right shoulder: Secondary | ICD-10-CM | POA: Diagnosis not present

## 2024-03-30 DIAGNOSIS — I10 Essential (primary) hypertension: Secondary | ICD-10-CM | POA: Diagnosis not present

## 2024-04-29 DIAGNOSIS — I10 Essential (primary) hypertension: Secondary | ICD-10-CM | POA: Diagnosis not present

## 2024-05-09 DIAGNOSIS — Z299 Encounter for prophylactic measures, unspecified: Secondary | ICD-10-CM | POA: Diagnosis not present

## 2024-05-09 DIAGNOSIS — J209 Acute bronchitis, unspecified: Secondary | ICD-10-CM | POA: Diagnosis not present

## 2024-05-09 DIAGNOSIS — I10 Essential (primary) hypertension: Secondary | ICD-10-CM | POA: Diagnosis not present

## 2024-05-09 DIAGNOSIS — R059 Cough, unspecified: Secondary | ICD-10-CM | POA: Diagnosis not present

## 2024-05-13 DIAGNOSIS — E119 Type 2 diabetes mellitus without complications: Secondary | ICD-10-CM | POA: Diagnosis not present

## 2024-05-30 DIAGNOSIS — I10 Essential (primary) hypertension: Secondary | ICD-10-CM | POA: Diagnosis not present

## 2024-06-03 DIAGNOSIS — Z8719 Personal history of other diseases of the digestive system: Secondary | ICD-10-CM | POA: Diagnosis not present

## 2024-06-03 DIAGNOSIS — Z299 Encounter for prophylactic measures, unspecified: Secondary | ICD-10-CM | POA: Diagnosis not present

## 2024-06-03 DIAGNOSIS — E119 Type 2 diabetes mellitus without complications: Secondary | ICD-10-CM | POA: Diagnosis not present

## 2024-06-03 DIAGNOSIS — C61 Malignant neoplasm of prostate: Secondary | ICD-10-CM | POA: Diagnosis not present

## 2024-06-03 DIAGNOSIS — I10 Essential (primary) hypertension: Secondary | ICD-10-CM | POA: Diagnosis not present

## 2024-06-29 DIAGNOSIS — I10 Essential (primary) hypertension: Secondary | ICD-10-CM | POA: Diagnosis not present

## 2024-07-26 ENCOUNTER — Telehealth: Payer: Self-pay

## 2024-07-26 NOTE — Telephone Encounter (Signed)
 PSA request from [facility] Last PSA (March 2025) sent via records release [id number]

## 2024-07-30 DIAGNOSIS — I10 Essential (primary) hypertension: Secondary | ICD-10-CM | POA: Diagnosis not present

## 2024-07-31 ENCOUNTER — Ambulatory Visit: Admitting: Urology

## 2024-07-31 ENCOUNTER — Encounter: Payer: Self-pay | Admitting: Urology

## 2024-07-31 VITALS — BP 100/58 | HR 75

## 2024-07-31 DIAGNOSIS — N401 Enlarged prostate with lower urinary tract symptoms: Secondary | ICD-10-CM

## 2024-07-31 DIAGNOSIS — C61 Malignant neoplasm of prostate: Secondary | ICD-10-CM | POA: Diagnosis not present

## 2024-07-31 DIAGNOSIS — N138 Other obstructive and reflux uropathy: Secondary | ICD-10-CM

## 2024-07-31 DIAGNOSIS — R351 Nocturia: Secondary | ICD-10-CM | POA: Diagnosis not present

## 2024-07-31 MED ORDER — SOLIFENACIN SUCCINATE 5 MG PO TABS
5.0000 mg | ORAL_TABLET | Freq: Every day | ORAL | 11 refills | Status: AC
Start: 2024-07-31 — End: ?

## 2024-07-31 MED ORDER — TAMSULOSIN HCL 0.4 MG PO CAPS: 0.4000 mg | ORAL_CAPSULE | Freq: Every day | ORAL | 11 refills | Status: AC

## 2024-07-31 NOTE — Progress Notes (Signed)
 07/31/2024 2:46 PM   Spencer Mills April 06, 1951 [id number]  Referring provider: Maree Isles, MD [address] [place],  [place] [address]  Followup prostate cancer   HPI: [name] is a 73yo here for followup for prostate cancer and BPH with nocturia. No recent PSA. IPSS 6 QOL 3. He remians on flomax  0.4mg  daily and vesicare  5mg  daily. Uirne stream mostly strong. No straining to urinate. Nocturia 1x.    PMH: Past Medical History:  Diagnosis Date   Arthritis    Cancer (HCC)    Prostate cancer   Diabetes mellitus without complication (HCC)    Hep C w/o coma, chronic (HCC) 08/2016    Surgical History: Past Surgical History:  Procedure Laterality Date   CYSTOSCOPY N/A 09/15/2022   Procedure: FLEXIBLE CYSTOSCOPY;  Surgeon: Sherrilee Belvie CROME, MD;  Location: [facility];  Service: Urology;  Laterality: N/A;  No seeds detected in bladder per Dr. Sherrilee   NO PAST SURGERIES     RADIOACTIVE SEED IMPLANT N/A 09/15/2022   Procedure: RADIOACTIVE SEED IMPLANT/BRACHYTHERAPY IMPLANT;  Surgeon: Sherrilee Belvie CROME, MD;  Location: [facility];  Service: Urology;  Laterality: N/A;   SPACE OAR INSTILLATION N/A 09/15/2022   Procedure: SPACE OAR INSTILLATION;  Surgeon: Sherrilee Belvie CROME, MD;  Location: [facility];  Service: Urology;  Laterality: N/A;    Home Medications:  Allergies as of 07/31/2024   No Known Allergies      Medication List        Accurate as of July 31, 2024  2:46 PM. If you have any questions, ask your nurse or doctor.          amLODipine 5 MG tablet Commonly known as: NORVASC Take 5 mg by mouth daily.   aspirin EC 81 MG tablet Take 81 mg by mouth daily. Pt takes occasionally   lisinopril 40 MG tablet Commonly known as: ZESTRIL Take 40 mg by mouth daily.   rosuvastatin 10 MG tablet Commonly known as: CRESTOR Take by mouth.   solifenacin  5 MG tablet Commonly known as: VESICARE  Take 1 tablet (5 mg total)  by mouth daily.   tamsulosin  0.4 MG Caps capsule Commonly known as: FLOMAX  Take 1 capsule (0.4 mg total) by mouth daily.        Allergies: No Known Allergies  Family History: Family History  Family history unknown: Yes    Social History:  reports that he has been smoking cigarettes. He started smoking about 58 years ago. He has a 58.3 pack-year smoking history. He has never used smokeless tobacco. He reports current alcohol  use. He reports that he does not currently use drugs.  ROS: All other review of systems were reviewed and are negative except what is noted above in HPI  Physical Exam: BP (!) 100/58   Pulse 75   Constitutional:  Alert and oriented, No acute distress. HEENT: [place] AT, moist mucus membranes.  Trachea midline, no masses. Cardiovascular: No clubbing, cyanosis, or edema. Respiratory: Normal respiratory effort, no increased work of breathing. GI: Abdomen is soft, nontender, nondistended, no abdominal masses GU: No CVA tenderness.  Lymph: No cervical or inguinal lymphadenopathy. Skin: No rashes, bruises or suspicious lesions. Neurologic: Grossly intact, no focal deficits, moving all 4 extremities. Psychiatric: Normal mood and affect.  Laboratory Data: Lab Results  Component Value Date   WBC 8.8 12/08/2016   HGB 13.6 09/15/2022   HCT 40.0 09/15/2022   MCV 92.4 12/08/2016   PLT 217 12/08/2016  Lab Results  Component Value Date   CREATININE 1.10 09/15/2022    No results found for: PSA  Lab Results  Component Value Date   TESTOSTERONE  <3 (L) 07/05/2022    No results found for: HGBA1C  Urinalysis    Component Value Date/Time   APPEARANCEUR Clear 01/22/2024 1446   GLUCOSEU Negative 01/22/2024 1446   BILIRUBINUR Negative 01/22/2024 1446   PROTEINUR 2+ (A) 01/22/2024 1446   NITRITE Negative 01/22/2024 1446   LEUKOCYTESUR Negative 01/22/2024 1446    Lab Results  Component Value Date   LABMICR See below: 01/22/2024   WBCUA None seen  01/22/2024   LABEPIT None seen 01/22/2024   MUCUS Present 07/12/2022   BACTERIA None seen 01/22/2024    Pertinent Imaging:  No results found for this or any previous visit.  No results found for this or any previous visit.  No results found for this or any previous visit.  No results found for this or any previous visit.  No results found for this or any previous visit.  No results found for this or any previous visit.  No results found for this or any previous visit.  No results found for this or any previous visit.   Assessment & Plan:    1. Prostate cancer (HCC) (Primary) PSA today, will call with results -followup 6 months with PSA - Urinalysis, Routine w reflex microscopic  2. Benign prostatic hyperplasia with urinary obstruction Continue flomax  0.4mg  daily  3. Nocturia Continue vesicare  5mg  daily   No follow-ups on file.  Belvie Clara, MD  [facility] [place]

## 2024-07-31 NOTE — Patient Instructions (Signed)

## 2024-08-01 LAB — PSA: Prostate Specific Ag, Serum: <0.1 ng/mL (ref 0.0–4.0)

## 2024-08-15 DIAGNOSIS — C61 Malignant neoplasm of prostate: Secondary | ICD-10-CM | POA: Diagnosis not present

## 2024-09-09 DIAGNOSIS — E7800 Pure hypercholesterolemia, unspecified: Secondary | ICD-10-CM | POA: Diagnosis not present

## 2024-09-09 DIAGNOSIS — I10 Essential (primary) hypertension: Secondary | ICD-10-CM | POA: Diagnosis not present

## 2024-09-09 DIAGNOSIS — Z79899 Other long term (current) drug therapy: Secondary | ICD-10-CM | POA: Diagnosis not present

## 2024-09-09 DIAGNOSIS — E119 Type 2 diabetes mellitus without complications: Secondary | ICD-10-CM | POA: Diagnosis not present

## 2024-09-09 DIAGNOSIS — C61 Malignant neoplasm of prostate: Secondary | ICD-10-CM | POA: Diagnosis not present

## 2024-09-09 DIAGNOSIS — Z299 Encounter for prophylactic measures, unspecified: Secondary | ICD-10-CM | POA: Diagnosis not present

## 2025-01-23 ENCOUNTER — Other Ambulatory Visit

## 2025-01-29 ENCOUNTER — Ambulatory Visit: Admitting: Urology
